# Patient Record
Sex: Female | Born: 1935 | Hispanic: Yes | Marital: Married | Smoking: Never smoker
Health system: Southern US, Community
[De-identification: ages and names within clinical notes are randomized; demographics above are authoritative.]

## PROBLEM LIST (undated history)

## (undated) DIAGNOSIS — R519 Headache, unspecified: Secondary | ICD-10-CM

## (undated) DIAGNOSIS — G2581 Restless legs syndrome: Secondary | ICD-10-CM

## (undated) DIAGNOSIS — I499 Cardiac arrhythmia, unspecified: Secondary | ICD-10-CM

## (undated) DIAGNOSIS — J449 Chronic obstructive pulmonary disease, unspecified: Secondary | ICD-10-CM

## (undated) DIAGNOSIS — K219 Gastro-esophageal reflux disease without esophagitis: Secondary | ICD-10-CM

## (undated) DIAGNOSIS — M1990 Unspecified osteoarthritis, unspecified site: Secondary | ICD-10-CM

## (undated) DIAGNOSIS — Z972 Presence of dental prosthetic device (complete) (partial): Secondary | ICD-10-CM

## (undated) DIAGNOSIS — I209 Angina pectoris, unspecified: Secondary | ICD-10-CM

## (undated) DIAGNOSIS — F419 Anxiety disorder, unspecified: Secondary | ICD-10-CM

## (undated) DIAGNOSIS — G4730 Sleep apnea, unspecified: Secondary | ICD-10-CM

## (undated) DIAGNOSIS — R51 Headache: Secondary | ICD-10-CM

## (undated) DIAGNOSIS — H9190 Unspecified hearing loss, unspecified ear: Secondary | ICD-10-CM

## (undated) DIAGNOSIS — R160 Hepatomegaly, not elsewhere classified: Secondary | ICD-10-CM

## (undated) DIAGNOSIS — R0601 Orthopnea: Secondary | ICD-10-CM

## (undated) DIAGNOSIS — Z9289 Personal history of other medical treatment: Secondary | ICD-10-CM

## (undated) DIAGNOSIS — H8109 Meniere's disease, unspecified ear: Secondary | ICD-10-CM

## (undated) DIAGNOSIS — R0600 Dyspnea, unspecified: Secondary | ICD-10-CM

## (undated) DIAGNOSIS — I10 Essential (primary) hypertension: Secondary | ICD-10-CM

## (undated) HISTORY — PX: BREAST BIOPSY: SHX20

## (undated) HISTORY — PX: CARDIAC CATHETERIZATION: SHX172

## (undated) HISTORY — PX: COLONOSCOPY: SHX174

---

## 2014-12-20 ENCOUNTER — Emergency Department
Admission: EM | Admit: 2014-12-20 | Discharge: 2014-12-20 | Disposition: A | Discharge status: Home / Self Care | Payer: Medicare (Managed Care) | Attending: Emergency Medicine | Admitting: Emergency Medicine

## 2014-12-20 ENCOUNTER — Emergency Department: Payer: Medicare (Managed Care)

## 2014-12-20 ENCOUNTER — Encounter: Payer: Self-pay | Admitting: Emergency Medicine

## 2014-12-20 DIAGNOSIS — I10 Essential (primary) hypertension: Secondary | ICD-10-CM | POA: Diagnosis not present

## 2014-12-20 DIAGNOSIS — R0602 Shortness of breath: Secondary | ICD-10-CM | POA: Diagnosis present

## 2014-12-20 DIAGNOSIS — R531 Weakness: Secondary | ICD-10-CM

## 2014-12-20 DIAGNOSIS — J209 Acute bronchitis, unspecified: Secondary | ICD-10-CM | POA: Diagnosis not present

## 2014-12-20 DIAGNOSIS — J40 Bronchitis, not specified as acute or chronic: Secondary | ICD-10-CM

## 2014-12-20 LAB — CBC
HCT: 37.4 % (ref 35.0–47.0)
Hemoglobin: 12.5 g/dL (ref 12.0–16.0)
MCH: 30.6 pg (ref 26.0–34.0)
MCHC: 33.4 g/dL (ref 32.0–36.0)
MCV: 91.8 fL (ref 80.0–100.0)
PLATELETS: 108 10*3/uL — AB (ref 150–440)
RBC: 4.07 MIL/uL (ref 3.80–5.20)
RDW: 13.9 % (ref 11.5–14.5)
WBC: 7.1 10*3/uL (ref 3.6–11.0)

## 2014-12-20 LAB — BASIC METABOLIC PANEL
ANION GAP: 3 — AB (ref 5–15)
BUN: 14 mg/dL (ref 6–20)
CALCIUM: 9.1 mg/dL (ref 8.9–10.3)
CO2: 30 mmol/L (ref 22–32)
CREATININE: 0.56 mg/dL (ref 0.44–1.00)
Chloride: 106 mmol/L (ref 101–111)
GFR calc Af Amer: >60 mL/min (ref >60)
GFR calc non Af Amer: >60 mL/min (ref >60)
GLUCOSE: 105 mg/dL — AB (ref 65–99)
Potassium: 3.5 mmol/L (ref 3.5–5.1)
Sodium: 139 mmol/L (ref 135–145)

## 2014-12-20 LAB — TROPONIN I: Troponin I: <0.03 ng/mL (ref <0.031)

## 2014-12-20 MED ORDER — SODIUM CHLORIDE 0.9 % IV BOLUS (SEPSIS)
500.0000 mL | Freq: Once | INTRAVENOUS | Status: AC
  Administered 2014-12-20: 500 mL via INTRAVENOUS

## 2014-12-20 MED ORDER — ALBUTEROL SULFATE (2.5 MG/3ML) 0.083% IN NEBU: 2.5000 mg | INHALATION_SOLUTION | RESPIRATORY_TRACT | Status: DC | PRN

## 2014-12-20 MED ORDER — LEVOFLOXACIN IN D5W 500 MG/100ML IV SOLN
500.0000 mg | Freq: Once | INTRAVENOUS | Status: AC
  Administered 2014-12-20: 500 mg via INTRAVENOUS
  Filled 2014-12-20: qty 100

## 2014-12-20 MED ORDER — LEVOFLOXACIN 500 MG PO TABS: 500.0000 mg | ORAL_TABLET | Freq: Every day | ORAL | Status: AC

## 2014-12-20 MED ORDER — HYDROCOD POLST-CPM POLST ER 10-8 MG/5ML PO SUER: 5.0000 mL | Freq: Two times a day (BID) | ORAL | Status: DC

## 2014-12-20 MED ORDER — HYDROCOD POLST-CPM POLST ER 10-8 MG/5ML PO SUER
5.0000 mL | Freq: Once | ORAL | Status: AC
  Administered 2014-12-20: 5 mL via ORAL
  Filled 2014-12-20: qty 5

## 2014-12-20 NOTE — Discharge Instructions (Signed)
1. Take antibiotic as prescribed (Levaquin 500 mg twice daily 5 days). 2. You may take cough medicine as needed (Tussionex). 3. You may use albuterol nebulizer every 4 hours as needed for breathing difficulty. 4. Return to the ER for worsening symptoms, persistent vomiting, difficulty breathing or other concerns.  Debilidad  (Weakness)  La debilidad es la falta de energa. Se puede sentir en todo el cuerpo (generalizada) o en una parte especfica del cuerpo (focalizada).Verner Chol. Algunas causas de la debilidad pueden ser graves. Es posible que necesite una evaluacin mdica ms profunda, especialmente si usted es anciano o tiene historia de inmunosupresin (como quimioterapia o VIH), enfermedad renal, enfermedad cardiaca o diabetes. CAUSAS  La debilidad puede deberse a muchas afecciones diferentes:   Infecciones.  Cansancio fsico extremo.  Hemorragia interna u otras prdidas de sangre que causa un dficit de glbulos rojos (anemia).  Deshidratacin. Esta causa es ms frecuente en personas mayores.  Efectos secundarios o anormalidades electrolticas por medicamentos, como analgsicos o sedantes.  Estrs emocional, ansiedad o depresin.  Problemas de circulacin, especialmente enfermedad arterial perifrica grave.  Enfermedades cardacas, como la fibrilacin auricular rpida, la bradicardia o la insuficiencia cardaca.  Los trastornos del [address] Streetsistema nervioso, como el sndrome de CarneyGuillain-Barr, esclerosis mltiple o ictus. DIAGNSTICO  Para hallar la causa de la debilidad, el mdico le har una historia clnica y un examen fsico. Si es necesario, le indicarn pruebas de laboratorio o Recruitment consultantradiografas.  TRATAMIENTO  El tratamiento de la debilidad depende de la causa de sus sntomas y puede Electrical engineervariar mucho.  INSTRUCCIONES PARA EL CUIDADO EN EL HOGAR   Descanse todo lo que sea necesario.  Consuma una dieta bien balanceada.  Trate de Materials engineerhacer ejercicio CarMaxtodos los das.  Tome slo medicamentos de venta  libre o recetados, segn las indicaciones del mdico. SOLICITE ATENCIN MDICA SI:   La debilidad parece empeorar o se extiende a otras partes del cuerpo.  Siente nuevos dolores o dolencias. SOLICITE ATENCIN MDICA DE INMEDIATO SI:   No puede realizar sus actividades normales diarias, como vestirse y alimentarse.  No puede subir y Architectural technologistbajar escaleras o se siente agotado cuando lo hace.  Tiene dificultad para respirar o le duele pecho.  Tiene dificultad para mover partes del cuerpo.  Siente debilidad en una sola zona del cuerpo o solo en un lado del cuerpo.  Tiene fiebre.  Tiene problemas para hablar o tragar.  No puede controlar la vejiga o los movimientos intestinales.  La materia fecal o el vmito son negros o Investment banker, operationaltienen sangre. ASEGRESE DE QUE:   Comprende estas instrucciones.  Controlar su enfermedad.  Solicitar ayuda de inmediato si no mejora o si empeora.   Esta informacin no tiene Theme park managercomo fin reemplazar el consejo del mdico. Asegrese de hacerle al mdico cualquier pregunta que tenga.   Document Released: 02/23/2006 Document Revised: 07/14/2011 Elsevier Interactive Patient Education [employer]2016 Elsevier Inc.

## 2014-12-20 NOTE — ED Provider Notes (Signed)
[facility] Emergency Department Provider Note  ____________________________________________  Time seen: Approximately 2:31 AM  I have reviewed the triage vital signs and the nursing notes.   HISTORY  Chief Complaint Shortness of Breath    HPI Ann Welch is a 81 y.o. female who presents to the ED from home with a chief complaint of nonproductive cough, chills, runny nose and shortness of breath. Onset of symptoms 3 days ago. Patient complains of subjective fever with chills, nonproductive cough associated with occasional shortness of breath. Also complains of runny nose and hoarse voice. Denies recent travel or trauma. Denies chest pain, abdominal pain, nausea, vomiting, diarrhea, dysuria. Complains of generalized weakness. Nothing makes her symptoms better or worse. States she has a nebulizer machine at home which she used several years ago when she had pneumonia, but she no longer has medicine for her nebulizer machine. Denies past medical history of lung issues.   Past medical history Hypertension Hyperlipidemia  There are no active problems to display for this patient.   No past surgical history on file.  No current outpatient prescriptions on file.  Allergies Review of patient's allergies indicates no known allergies.  No family history on file.  Social History Social History  Substance Use Topics  . Smoking status: Never Smoker   . Smokeless tobacco: Never Used  . Alcohol Use: No    Review of Systems Constitutional: Positive for subjective fever/chills. Positive for generalized weakness. Eyes: No visual changes. ENT: Positive for hoarse voice. No sore throat. Cardiovascular: Denies chest pain. Respiratory: Positive for nonproductive cough and occasional shortness of breath. Gastrointestinal: No abdominal pain.  No nausea, no vomiting.  No diarrhea.  No constipation. Genitourinary: Negative for dysuria. Musculoskeletal: Negative for  back pain. Skin: Negative for rash. Neurological: Negative for headaches, focal weakness or numbness.  10-point ROS otherwise negative.  ____________________________________________   PHYSICAL EXAM:  VITAL SIGNS: ED Triage Vitals  Enc Vitals Group     BP 12/20/14 0135 148/73 mmHg     Pulse Rate 12/20/14 0135 91     Resp 12/20/14 0135 19     Temp 12/20/14 0135 98 F (36.7 C)     Temp Source 12/20/14 0135 Oral     SpO2 12/20/14 0135 98 %     Weight --      Height --      Head Cir --      Peak Flow --      Pain Score 12/20/14 0128 9     Pain Loc --      Pain Edu? --      Excl. in GC? --     Constitutional: Alert and oriented. Well appearing and in no acute distress. Eyes: Conjunctivae are normal. PERRL. EOMI. Head: Atraumatic. Nose: Congestion/rhinnorhea. Mouth/Throat: Mucous membranes are moist.  Oropharynx non-erythematous. Mildly hoarse voice. There is no tonsillar exudate, swelling or peritonsillar abscess. There is no muffled voice or drooling. Neck: No stridor.  No carotid bruits. Hematological/Lymphatic/Immunilogical: No cervical lymphadenopathy. Cardiovascular: Normal rate, regular rhythm. Grossly normal heart sounds.  Good peripheral circulation. Respiratory: Normal respiratory effort.  No retractions. Lungs CTAB. Specifically, there is no wheezing or rales. Gastrointestinal: Soft and nontender. No distention. No abdominal bruits. No CVA tenderness. Musculoskeletal: No lower extremity tenderness nor edema.  No joint effusions. Neurologic:  Normal speech and language. No gross focal neurologic deficits are appreciated. No gait instability. Skin:  Skin is warm, dry and intact. No rash noted. Psychiatric: Mood and affect are normal.  Speech and behavior are normal.  ____________________________________________   LABS (all labs ordered are listed, but only abnormal results are displayed)  Labs Reviewed  BASIC METABOLIC PANEL - Abnormal; Notable for the following:     Glucose, Bld 105 (*)    Anion gap 3 (*)    All other components within normal limits  CBC - Abnormal; Notable for the following:    Platelets 108 (*)    All other components within normal limits  TROPONIN I   ____________________________________________  EKG  ED ECG REPORT I, SUNG,JADE J, the attending physician, personally viewed and interpreted this ECG.   Date: 12/20/2014  EKG Time: 0127  Rate: 91  Rhythm: normal EKG, normal sinus rhythm  Axis: LAD  Intervals:none  ST&T Change: Nonspecific  ____________________________________________  RADIOLOGY  Chest 2 view (viewed by me, interpreted per Dr. Andria MeuseStevens): No active cardiopulmonary disease.  ____________________________________________   PROCEDURES  Procedure(s) performed: None  Critical Care performed: No  ____________________________________________   INITIAL IMPRESSION / ASSESSMENT AND PLAN / ED COURSE  Pertinent labs & imaging results that were available during my care of the patient were reviewed by me and considered in my medical decision making (see chart for details).  81 year old female who presents with nonproductive cough and subjective fever 2 days. Laboratory and imaging results unremarkable for pneumonia. However, given patient's elderly age she is at high risk for pneumonia. Will infuse IV fluids for her complaint of generalized weakness and start a course of antibiotics.  ----------------------------------------- 3:58 AM on 12/20/2014 -----------------------------------------  Patient improved. Inpatient for discharge. IV antibiotics almost finished infusing. Strict return precautions given. Patient and family verbalize understanding and agree with plan of care. ____________________________________________   FINAL CLINICAL IMPRESSION(S) / ED DIAGNOSES  Final diagnoses:  Bronchitis  Weakness      Irean HongJade J Sung, MD 12/20/14 [phone]

## 2014-12-20 NOTE — ED Notes (Signed)
  Pt presents to ED with c/on SOB, dry cough and runny nose x3 days. Pt alerts and oriented x4 at this time.

## 2015-02-19 ENCOUNTER — Other Ambulatory Visit: Payer: Self-pay | Admitting: Family Medicine

## 2015-02-19 DIAGNOSIS — Z1231 Encounter for screening mammogram for malignant neoplasm of breast: Secondary | ICD-10-CM

## 2015-02-26 ENCOUNTER — Ambulatory Visit: Payer: Medicare (Managed Care) | Attending: Family Medicine

## 2016-06-25 ENCOUNTER — Emergency Department
Admission: EM | Admit: 2016-06-25 | Discharge: 2016-06-25 | Disposition: A | Discharge status: Home / Self Care | Payer: Medicare Other | Attending: Emergency Medicine | Admitting: Emergency Medicine

## 2016-06-25 ENCOUNTER — Encounter: Payer: Self-pay | Admitting: Emergency Medicine

## 2016-06-25 DIAGNOSIS — I10 Essential (primary) hypertension: Secondary | ICD-10-CM | POA: Insufficient documentation

## 2016-06-25 DIAGNOSIS — Z79899 Other long term (current) drug therapy: Secondary | ICD-10-CM | POA: Diagnosis not present

## 2016-06-25 DIAGNOSIS — L509 Urticaria, unspecified: Secondary | ICD-10-CM

## 2016-06-25 HISTORY — DX: Essential (primary) hypertension: I10

## 2016-06-25 MED ORDER — METHYLPREDNISOLONE SODIUM SUCC 125 MG IJ SOLR
70.0000 mg | Freq: Once | INTRAMUSCULAR | Status: AC
  Administered 2016-06-25: 70 mg via INTRAMUSCULAR
  Filled 2016-06-25: qty 2

## 2016-06-25 MED ORDER — PREDNISONE 10 MG (21) PO TBPK: ORAL_TABLET | Freq: Every day | ORAL | 0 refills | Status: AC

## 2016-06-25 NOTE — ED Provider Notes (Signed)
[facility] Emergency Department Provider Note  ____________________________________________  Time seen: Approximately 8:07 PM  I have reviewed the triage vital signs and the nursing notes.   HISTORY  Chief Complaint Rash    HPI Ann Welch is a 83 y.o. female presenting to the emergency department with diffuse hives of the left upper extremity after receiving Zostavax yesterday. Patient states that she has had myalgias throughout the day. She denies facial edema, shortness of breath, chest pain, chest tightness, nausea, vomiting or abdominal pain. No alleviating measures have been attempted. Patient has been afebrile. 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   Past Medical History:  Diagnosis Date  . Hypertension     There are no active problems to display for this patient.   History reviewed. No pertinent surgical history.  Prior to Admission medications   Medication Sig Start Date End Date Taking? Authorizing Provider  albuterol (PROVENTIL) (2.5 MG/3ML) 0.083% nebulizer solution Take 3 mLs (2.5 mg total) by nebulization every 4 (four) hours as needed for wheezing or shortness of breath. 12/20/14   Irean Hong, MD  chlorpheniramine-HYDROcodone (TUSSIONEX PENNKINETIC ER) 10-8 MG/5ML SUER Take 5 mLs by mouth 2 (two) times daily. 12/20/14   Irean Hong, MD  predniSONE (STERAPRED UNI-PAK 21 TAB) 10 MG (21) TBPK tablet Take by mouth daily. Take 6 tablets the first day, take 5 tablets the second day, take 4 tablets the third day, take 3 tablets the fourth day, take 2 tablets the fifth day, take 1 tablet the sixth day. 06/25/16 07/01/16  Orvil Feil, PA-C    Allergies Patient has no known allergies.  No family history on file.  Social History Social History  Substance Use Topics  . Smoking status: Never Smoker  . Smokeless tobacco: Never Used  . Alcohol use No     Review of Systems  Constitutional: No fever/chills Eyes: No visual  changes. No discharge ENT: No upper respiratory complaints. Cardiovascular: no chest pain. Respiratory: no cough. No SOB. Gastrointestinal: No abdominal pain.  No nausea, no vomiting.  No diarrhea.  No constipation. Musculoskeletal: Negative for musculoskeletal pain. Skin: Patient has hives of the left upper extremity Neurological: Negative for headaches, focal weakness or numbness.   ____________________________________________   PHYSICAL EXAM:  VITAL SIGNS: ED Triage Vitals [06/25/16 1907]  Enc Vitals Group     BP 138/73     Pulse Rate 92     Resp 18     Temp 99.1 F (37.3 C)     Temp Source Oral     SpO2 98 %     Weight 129 lb (58.5 kg)     Height 5\' 1"  (1.549 m)     Head Circumference      Peak Flow      Pain Score 9     Pain Loc      Pain Edu?      Excl. in GC?      Constitutional: Alert and oriented. Well appearing and in no acute distress. Eyes: Conjunctivae are normal. PERRL. EOMI. Head: Atraumatic. Cardiovascular: Normal rate, regular rhythm. Normal S1 and S2.  Good peripheral circulation. Respiratory: Normal respiratory effort without tachypnea or retractions. Lungs CTAB. Good air entry to the bases with no decreased or absent breath sounds. Musculoskeletal: Full range of motion to all extremities. No gross deformities appreciated. Neurologic:  Normal speech and language. No gross focal neurologic deficits are appreciated.  Skin: Patient has diffuse hives of the left upper  extremity. Psychiatric: Mood and affect are normal. Speech and behavior are normal. Patient exhibits appropriate insight and judgement.   ____________________________________________   LABS (all labs ordered are listed, but only abnormal results are displayed)  Labs Reviewed - No data to display ____________________________________________  EKG   ____________________________________________  RADIOLOGY   No results  found.  ____________________________________________    PROCEDURES  Procedure(s) performed:    Procedures    Medications  methylPREDNISolone sodium succinate (SOLU-MEDROL) 125 mg/2 mL injection 70 mg (70 mg Intramuscular Given 06/25/16 2009)     ____________________________________________   INITIAL IMPRESSION / ASSESSMENT AND PLAN / ED COURSE  Pertinent labs & imaging results that were available during my care of the patient were reviewed by me and considered in my medical decision making (see chart for details).  Review of the [place] CSRS was performed in accordance of the [facility] prior to dispensing any controlled drugs.    Assessment and plan: Hives: Patient presents to the emergency department with diffuse hives of the left upper extremity after receiving Zostavax. Overall physical exam was reassuring. Patient was given an injection of Solu-Medrol in the emergency department. She was discharged with tapered prednisone. Vital signs were reassuring prior to discharge. All patient questions were answered.     ____________________________________________  FINAL CLINICAL IMPRESSION(S) / ED DIAGNOSES  Final diagnoses:  Hives      NEW MEDICATIONS STARTED DURING THIS VISIT:  New Prescriptions   PREDNISONE (STERAPRED UNI-PAK 21 TAB) 10 MG (21) TBPK TABLET    Take by mouth daily. Take 6 tablets the first day, take 5 tablets the second day, take 4 tablets the third day, take 3 tablets the fourth day, take 2 tablets the fifth day, take 1 tablet the sixth day.        This chart was dictated using voice recognition software/Dragon. Despite best efforts to proofread, errors can occur which can change the meaning. Any change was purely unintentional.    Gasper LloydWoods, Jaclyn M, PA-C 06/25/16 2013    Loleta RoseForbach, Cory, MD 06/25/16 2135

## 2016-06-25 NOTE — ED Triage Notes (Signed)
Pt ambulatory to triage with steady gait. Pt reports had shingles vaccine yesterday in LEFT arm and developed swelling and pain to LEFT upper arm last night. Hot to touch. Redness and swelling noted. Pt denies shortness of breath. Pt speaking in complete sentences, respirations even and unlabored.

## 2016-07-09 ENCOUNTER — Emergency Department: Payer: Medicare Other

## 2016-07-09 ENCOUNTER — Emergency Department
Admission: EM | Admit: 2016-07-09 | Discharge: 2016-07-09 | Disposition: A | Discharge status: Home / Self Care | Payer: Medicare Other | Attending: Emergency Medicine | Admitting: Emergency Medicine

## 2016-07-09 DIAGNOSIS — R0789 Other chest pain: Secondary | ICD-10-CM | POA: Diagnosis not present

## 2016-07-09 DIAGNOSIS — I10 Essential (primary) hypertension: Secondary | ICD-10-CM | POA: Diagnosis not present

## 2016-07-09 DIAGNOSIS — R079 Chest pain, unspecified: Secondary | ICD-10-CM

## 2016-07-09 DIAGNOSIS — Z79899 Other long term (current) drug therapy: Secondary | ICD-10-CM | POA: Diagnosis not present

## 2016-07-09 DIAGNOSIS — R000 Tachycardia, unspecified: Secondary | ICD-10-CM | POA: Insufficient documentation

## 2016-07-09 DIAGNOSIS — Z7982 Long term (current) use of aspirin: Secondary | ICD-10-CM | POA: Diagnosis not present

## 2016-07-09 LAB — BASIC METABOLIC PANEL
Anion gap: 7 (ref 5–15)
BUN: 10 mg/dL (ref 6–20)
CALCIUM: 9.3 mg/dL (ref 8.9–10.3)
CO2: 26 mmol/L (ref 22–32)
CREATININE: 0.45 mg/dL (ref 0.44–1.00)
Chloride: 104 mmol/L (ref 101–111)
Glucose, Bld: 111 mg/dL — ABNORMAL HIGH (ref 65–99)
Potassium: 3.6 mmol/L (ref 3.5–5.1)
Sodium: 137 mmol/L (ref 135–145)

## 2016-07-09 LAB — CBC
HCT: 39.1 % (ref 35.0–47.0)
Hemoglobin: 13.3 g/dL (ref 12.0–16.0)
MCH: 30.3 pg (ref 26.0–34.0)
MCHC: 34 g/dL (ref 32.0–36.0)
MCV: 89.1 fL (ref 80.0–100.0)
PLATELETS: 162 10*3/uL (ref 150–440)
RBC: 4.39 MIL/uL (ref 3.80–5.20)
RDW: 13.8 % (ref 11.5–14.5)
WBC: 6.3 10*3/uL (ref 3.6–11.0)

## 2016-07-09 LAB — TROPONIN I: Troponin I: <0.03 ng/mL (ref <0.03)

## 2016-07-09 MED ORDER — DILTIAZEM HCL 60 MG PO TABS
60.0000 mg | ORAL_TABLET | Freq: Two times a day (BID) | ORAL | 11 refills | Status: DC
Start: 2016-07-09 — End: 2017-12-29

## 2016-07-09 NOTE — ED Triage Notes (Signed)
Pt reports to ED w/ c/o chest tightness that started today at home.  Per EMS, pt checked HR w/ home pulse oximeter and HR 170's. Pt A/OX4, resp even and unlabored. HR 76 in triage.  EMS sts that HR 93 on scene.  Pt sts that she takes cardizem.  EMS gave pt 324 ASA.  Pt reports no chest tightness at this time, no dizziness or nausea.

## 2016-07-09 NOTE — ED Provider Notes (Signed)
[facility] Emergency Department Provider Note       Time seen: ----------------------------------------- 12:02 PM on 07/09/2016 -----------------------------------------     I have reviewed the triage vital signs and the nursing notes.   HISTORY   Chief Complaint Chest Pain    HPI Ann Welch is a 83 y.o. female who presents to the ED for chest tightness that started today at home. According to EMS she had checked her heart rate with a home pulse oximeter and a heart rate was in the 170s. Lasted for around 15 minutes and then subsequently resolved. Patient states she takes Cardizem, EMS gave aspirin prior to arrival. She reports no chest tightness at this time, no dizziness or nausea. She denies any recent changes in her medicines.   Past Medical History:  Diagnosis Date  . Hypertension     There are no active problems to display for this patient.   History reviewed. No pertinent surgical history.  Allergies Patient has no known allergies.  Social History Social History  Substance Use Topics  . Smoking status: Never Smoker  . Smokeless tobacco: Never Used  . Alcohol use No    Review of Systems Constitutional: Negative for fever. Cardiovascular: Positive for chest tightness, palpitations Respiratory: Negative for shortness of breath. Gastrointestinal: Negative for abdominal pain, vomiting and diarrhea. Genitourinary: Negative for dysuria. Musculoskeletal: Negative for back pain. Skin: Negative for rash. Neurological: Negative for headaches, focal weakness or numbness.  All systems negative/normal/unremarkable except as stated in the HPI  ____________________________________________   PHYSICAL EXAM:  VITAL SIGNS: ED Triage Vitals  Enc Vitals Group     BP 07/09/16 1149 (!) 138/97     Pulse Rate 07/09/16 1149 80     Resp 07/09/16 1149 17     Temp 07/09/16 1149 97.7 F (36.5 C)     Temp Source 07/09/16 1149 Oral     SpO2  07/09/16 1149 93 %     Weight 07/09/16 1141 130 lb (59 kg)     Height 07/09/16 1141 5\' 1"  (1.549 m)     Head Circumference --      Peak Flow --      Pain Score 07/09/16 1141 5     Pain Loc --      Pain Edu? --      Excl. in GC? --     Constitutional: Alert and oriented. Well appearing and in no distress. Eyes: Conjunctivae are normal. Normal extraocular movements. ENT   Head: Normocephalic and atraumatic.   Nose: No congestion/rhinnorhea.   Mouth/Throat: Mucous membranes are moist.   Neck: No stridor. Cardiovascular: Normal rate, regular rhythm. No murmurs, rubs, or gallops. Respiratory: Normal respiratory effort without tachypnea nor retractions. Breath sounds are clear and equal bilaterally. No wheezes/rales/rhonchi. Gastrointestinal: Soft and nontender. Normal bowel sounds Musculoskeletal: Nontender with normal range of motion in extremities. No lower extremity tenderness nor edema. Neurologic:  Normal speech and language. No gross focal neurologic deficits are appreciated.  Skin:  Skin is warm, dry and intact. No rash noted. Psychiatric: Mood and affect are normal. Speech and behavior are normal.  ____________________________________________  EKG: Interpreted by me. Sinus rhythm. 81 bpm, normal PR interval, normal QRS, normal QT, left axis deviation  ____________________________________________  ED COURSE:  Pertinent labs & imaging results that were available during my care of the patient were reviewed by me and considered in my medical decision making (see chart for details). Patient presents for chest tightness and tachycardia, we will assess with  labs and imaging as indicated.   Procedures ____________________________________________   LABS (pertinent positives/negatives)  Labs Reviewed  BASIC METABOLIC PANEL - Abnormal; Notable for the following:       Result Value   Glucose, Bld 111 (*)    All other components within normal limits  CBC  TROPONIN I   TROPONIN I    RADIOLOGY  Chest x-ray IMPRESSION: No active cardiopulmonary disease. ____________________________________________  FINAL ASSESSMENT AND PLAN  Chest pain, palpitations  Plan: Patient's labs and imaging were dictated above. Patient had presented for tachycardia and chest tightness. No further tachydysrhythmia, initial troponin is negative.   Emily FilbertWilliams, Tesia Lybrand E, MD   Note: This note was generated in part or whole with voice recognition software. Voice recognition is usually quite accurate but there are transcription errors that can and very often do occur. I apologize for any typographical errors that were not detected and corrected.     Emily FilbertWilliams, Lakshya Mcgillicuddy E, MD 07/09/16 [phone]

## 2016-07-09 NOTE — ED Notes (Signed)
Patient transported to X-ray 

## 2016-07-09 NOTE — ED Notes (Signed)
Light green top sent to lab 

## 2016-10-22 ENCOUNTER — Emergency Department: Payer: Medicare Other

## 2016-10-22 ENCOUNTER — Emergency Department
Admission: EM | Admit: 2016-10-22 | Discharge: 2016-10-22 | Disposition: A | Discharge status: Home / Self Care | Payer: Medicare Other | Attending: Emergency Medicine | Admitting: Emergency Medicine

## 2016-10-22 DIAGNOSIS — R42 Dizziness and giddiness: Secondary | ICD-10-CM | POA: Insufficient documentation

## 2016-10-22 DIAGNOSIS — Z79899 Other long term (current) drug therapy: Secondary | ICD-10-CM | POA: Diagnosis not present

## 2016-10-22 DIAGNOSIS — R110 Nausea: Secondary | ICD-10-CM | POA: Diagnosis not present

## 2016-10-22 DIAGNOSIS — Z7982 Long term (current) use of aspirin: Secondary | ICD-10-CM | POA: Diagnosis not present

## 2016-10-22 DIAGNOSIS — I10 Essential (primary) hypertension: Secondary | ICD-10-CM | POA: Diagnosis not present

## 2016-10-22 LAB — CBC WITH DIFFERENTIAL/PLATELET
BASOS ABS: 0 10*3/uL (ref 0–0.1)
Basophils Relative: 0 %
Eosinophils Absolute: 0.2 10*3/uL (ref 0–0.7)
Eosinophils Relative: 4 %
HEMATOCRIT: 38.4 % (ref 35.0–47.0)
Hemoglobin: 13 g/dL (ref 12.0–16.0)
LYMPHS ABS: 1 10*3/uL (ref 1.0–3.6)
LYMPHS PCT: 17 %
MCH: 30.9 pg (ref 26.0–34.0)
MCHC: 34 g/dL (ref 32.0–36.0)
MCV: 90.9 fL (ref 80.0–100.0)
MONO ABS: 0.4 10*3/uL (ref 0.2–0.9)
Monocytes Relative: 6 %
NEUTROS ABS: 4.4 10*3/uL (ref 1.4–6.5)
Neutrophils Relative %: 73 %
Platelets: 149 10*3/uL — ABNORMAL LOW (ref 150–440)
RBC: 4.22 MIL/uL (ref 3.80–5.20)
RDW: 13.9 % (ref 11.5–14.5)
WBC: 6 10*3/uL (ref 3.6–11.0)

## 2016-10-22 LAB — HEPATIC FUNCTION PANEL
ALT: 21 U/L (ref 14–54)
AST: 22 U/L (ref 15–41)
Albumin: 3.9 g/dL (ref 3.5–5.0)
Alkaline Phosphatase: 63 U/L (ref 38–126)
TOTAL PROTEIN: 6.6 g/dL (ref 6.5–8.1)
Total Bilirubin: 0.3 mg/dL (ref 0.3–1.2)

## 2016-10-22 LAB — BASIC METABOLIC PANEL
ANION GAP: 8 (ref 5–15)
BUN: 15 mg/dL (ref 6–20)
CALCIUM: 9.1 mg/dL (ref 8.9–10.3)
CO2: 26 mmol/L (ref 22–32)
Chloride: 102 mmol/L (ref 101–111)
Creatinine, Ser: 0.62 mg/dL (ref 0.44–1.00)
GLUCOSE: 139 mg/dL — AB (ref 65–99)
POTASSIUM: 3.9 mmol/L (ref 3.5–5.1)
SODIUM: 136 mmol/L (ref 135–145)

## 2016-10-22 LAB — URINALYSIS, COMPLETE (UACMP) WITH MICROSCOPIC
BACTERIA UA: NONE SEEN
Bilirubin Urine: NEGATIVE
Glucose, UA: NEGATIVE mg/dL
Hgb urine dipstick: NEGATIVE
KETONES UR: NEGATIVE mg/dL
Nitrite: NEGATIVE
PROTEIN: NEGATIVE mg/dL
Specific Gravity, Urine: 1.005 (ref 1.005–1.030)
pH: 7 (ref 5.0–8.0)

## 2016-10-22 LAB — TROPONIN I

## 2016-10-22 MED ORDER — SODIUM CHLORIDE 0.9 % IV BOLUS (SEPSIS)
1000.0000 mL | Freq: Once | INTRAVENOUS | Status: AC
  Administered 2016-10-22: 1000 mL via INTRAVENOUS

## 2016-10-22 NOTE — Discharge Instructions (Signed)
Fortunately today your blood work and your MRI were reassuring. Please make an appointment to follow-up with your primary care physician this coming Monday for reevaluation and return to the emergency department sooner for any concerns whatsoever.  It was a pleasure to take care of you today, and thank you for coming to our emergency department.  If you have any questions or concerns before leaving please ask the nurse to grab me and I'm more than happy to go through your aftercare instructions again.  If you were prescribed any opioid pain medication today such as Norco, Vicodin, Percocet, morphine, hydrocodone, or oxycodone please make sure you do not drive when you are taking this medication as it can alter your ability to drive safely.  If you have any concerns once you are home that you are not improving or are in fact getting worse before you can make it to your follow-up appointment, please do not hesitate to call 911 and come back for further evaluation.  Merrily Brittle, MD  Results for orders placed or performed during the hospital encounter of 10/22/16  Basic metabolic panel  Result Value Ref Range   Sodium 136 135 - 145 mmol/L   Potassium 3.9 3.5 - 5.1 mmol/L   Chloride 102 101 - 111 mmol/L   CO2 26 22 - 32 mmol/L   Glucose, Bld 139 (H) 65 - 99 mg/dL   BUN 15 6 - 20 mg/dL   Creatinine, Ser 1.61 0.44 - 1.00 mg/dL   Calcium 9.1 8.9 - 09.6 mg/dL   GFR calc non Af Amer >60 >60 mL/min   GFR calc Af Amer >60 >60 mL/min   Anion gap 8 5 - 15  Hepatic function panel  Result Value Ref Range   Total Protein 6.6 6.5 - 8.1 g/dL   Albumin 3.9 3.5 - 5.0 g/dL   AST 22 15 - 41 U/L   ALT 21 14 - 54 U/L   Alkaline Phosphatase 63 38 - 126 U/L   Total Bilirubin 0.3 0.3 - 1.2 mg/dL   Bilirubin, Direct <0.4 (L) 0.1 - 0.5 mg/dL   Indirect Bilirubin NOT CALCULATED 0.3 - 0.9 mg/dL  Troponin I  Result Value Ref Range   Troponin I <0.03 <0.03 ng/mL  CBC with Differential  Result Value Ref Range     WBC 6.0 3.6 - 11.0 K/uL   RBC 4.22 3.80 - 5.20 MIL/uL   Hemoglobin 13.0 12.0 - 16.0 g/dL   HCT 54.0 98.1 - 19.1 %   MCV 90.9 80.0 - 100.0 fL   MCH 30.9 26.0 - 34.0 pg   MCHC 34.0 32.0 - 36.0 g/dL   RDW 47.8 29.5 - 62.1 %   Platelets 149 (L) 150 - 440 K/uL   Neutrophils Relative % 73 %   Neutro Abs 4.4 1.4 - 6.5 K/uL   Lymphocytes Relative 17 %   Lymphs Abs 1.0 1.0 - 3.6 K/uL   Monocytes Relative 6 %   Monocytes Absolute 0.4 0.2 - 0.9 K/uL   Eosinophils Relative 4 %   Eosinophils Absolute 0.2 0 - 0.7 K/uL   Basophils Relative 0 %   Basophils Absolute 0.0 0 - 0.1 K/uL  Urinalysis, Complete w Microscopic  Result Value Ref Range   Color, Urine STRAW (A) YELLOW   APPearance CLEAR (A) CLEAR   Specific Gravity, Urine 1.005 1.005 - 1.030   pH 7.0 5.0 - 8.0   Glucose, UA NEGATIVE NEGATIVE mg/dL   Hgb urine dipstick NEGATIVE NEGATIVE  Bilirubin Urine NEGATIVE NEGATIVE   Ketones, ur NEGATIVE NEGATIVE mg/dL   Protein, ur NEGATIVE NEGATIVE mg/dL   Nitrite NEGATIVE NEGATIVE   Leukocytes, UA TRACE (A) NEGATIVE   RBC / HPF 0-5 0 - 5 RBC/hpf   WBC, UA 0-5 0 - 5 WBC/hpf   Bacteria, UA NONE SEEN NONE SEEN   Squamous Epithelial / LPF 0-5 (A) NONE SEEN   Ct Head Wo Contrast  Result Date: 10/22/2016 CLINICAL DATA:  Nausea and dizziness today. EXAM: CT HEAD WITHOUT CONTRAST TECHNIQUE: Contiguous axial images were obtained from the base of the skull through the vertex without intravenous contrast. COMPARISON:  None. FINDINGS: Brain: No evidence of acute infarction, hemorrhage, hydrocephalus, extra-axial collection or mass lesion/mass effect. Vascular: No hyperdense vessel or unexpected calcification. Skull: Normal. Negative for fracture or focal lesion. Sinuses/Orbits: No acute finding. Other: None. IMPRESSION: No acute intracranial findings. Electronically Signed   By: Elberta Fortis M.D.   On: 10/22/2016 18:43   Mr Brain Wo Contrast (neuro Protocol)  Result Date: 10/22/2016 CLINICAL DATA:  83  y/o  F; central persistent vertigo. EXAM: MRI HEAD WITHOUT CONTRAST TECHNIQUE: Multiplanar, multiecho pulse sequences of the brain and surrounding structures were obtained without intravenous contrast. COMPARISON:  10/22/2016 CT of the head. FINDINGS: Brain: No acute infarction, hemorrhage, hydrocephalus, extra-axial collection or mass lesion. Few nonspecific foci of T2 FLAIR hyperintense signal abnormality in subcortical and periventricular white matter are compatible with mild chronic microvascular ischemic changes for age. Mild brain parenchymal volume loss. Vascular: Normal flow voids. Skull and upper cervical spine: Normal marrow signal. Sinuses/Orbits: Negative. Other: None. IMPRESSION: 1. No acute intracranial abnormality identified. 2. Mild chronic microvascular ischemic changes and mild parenchymal volume loss of the brain. Electronically Signed   By: Mitzi Hansen M.D.   On: 10/22/2016 22:42   Dg Chest Port 1 View  Result Date: 10/22/2016 CLINICAL DATA:  Acute, weakness and cough. EXAM: PORTABLE CHEST 1 VIEW COMPARISON:  07/09/2016 chest radiograph FINDINGS: The cardiomediastinal silhouette is unremarkable. There is no evidence of focal airspace disease, pulmonary edema, suspicious pulmonary nodule/mass, pleural effusion, or pneumothorax. No acute bony abnormalities are identified. IMPRESSION: No active disease. Electronically Signed   By: Harmon Pier M.D.   On: 10/22/2016 18:15

## 2016-10-22 NOTE — ED Provider Notes (Signed)
[facility] Emergency Department Provider Note  ____________________________________________   First MD Initiated Contact with Patient 10/22/16 1759     (approximate)  I have reviewed the triage vital signs and the nursing notes.   HISTORY  Chief Complaint Dizziness and Nausea   HPI Ann Welch is a 83 y.o. female who comes to the emergency department via EMS with nausea vomiting and lightheadedness. She said she was lying on the couch this afternoon when she stood up to walk towards the kitchen when she felt like she couldn't walk a straight line. She began veering off to the right nearly fell down. She found it difficult to stand up and every time she does stand up she feels lightheaded and off balance. One week ago her husband died and she has been having a difficult time dealing with the grief. She recently began taking alprazolam. She has no headache. She has no numbness or weakness. Her symptoms do persist even when she is at rest.   Past Medical History:  Diagnosis Date  . Hypertension     There are no active problems to display for this patient.   History reviewed. No pertinent surgical history.  Prior to Admission medications   Medication Sig Start Date End Date Taking? Authorizing Provider  aspirin EC 81 MG tablet Take 81 mg by mouth daily.   Yes [provider]  cholecalciferol (VITAMIN D) 1000 units tablet Take 1,000 Units by mouth daily.   Yes [provider]  diltiazem (CARDIZEM) 60 MG tablet Take 1 tablet (60 mg total) by mouth 2 (two) times daily. Patient taking differently: Take 30 mg by mouth daily.  07/09/16 07/09/17 Yes Emily Filbert, MD  Melatonin 5 MG TABS Take 1 tablet by mouth at bedtime.   Yes [provider]  Multiple Vitamin (MULTIVITAMIN) tablet Take 1 tablet by mouth daily.   Yes [provider]  vitamin E 400 UNIT capsule Take 400 Units by mouth daily.   Yes [provider]  albuterol (PROVENTIL) (2.5 MG/3ML) 0.083% nebulizer solution Take 3 mLs (2.5 mg total) by nebulization every 4 (four) hours as needed for wheezing or shortness of breath. 12/20/14   Irean Hong, MD  butalbital-acetaminophen-caffeine (FIORICET, [facility]) [phone] MG tablet  06/18/16   [provider]  chlorpheniramine-HYDROcodone Stevphen Meuse PENNKINETIC ER) 10-8 MG/5ML SUER Take 5 mLs by mouth 2 (two) times daily. Patient not taking: Reported on 10/22/2016 12/20/14   Irean Hong, MD    Allergies Patient has no known allergies.  History reviewed. No pertinent family history.  Social History Social History  Substance Use Topics  . Smoking status: Never Smoker  . Smokeless tobacco: Never Used  . Alcohol use No    Review of Systems Constitutional: No fever/chills Eyes: No visual changes. ENT: No sore throat. Cardiovascular: Denies chest pain. Respiratory: Denies shortness of breath. Gastrointestinal: No abdominal pain.  positive for nausea, no vomiting.  No diarrhea.  No constipation. Genitourinary: Negative for dysuria. Musculoskeletal: Negative for back pain. Skin: Negative for rash. Neurological: Negative for headaches, positive for ataxia   ____________________________________________   PHYSICAL EXAM:  VITAL SIGNS: ED Triage Vitals  Enc Vitals Group     BP      Pulse      Resp      Temp      Temp src      SpO2      Weight      Height  Head Circumference      Peak Flow      Pain Score      Pain Loc      Pain Edu?      Excl. in GC?     Constitutional: alert and oriented 4 appears somewhat uncomfortable closing her eyes frequently Eyes: PERRL EOMI.  no nystagmus appreciated Head: Atraumatic. Nose: No congestion/rhinnorhea. Mouth/Throat: No trismus Neck: No stridor.   Cardiovascular: Normal rate, regular rhythm. Grossly normal heart sounds.  Good peripheral circulation. Respiratory: Normal respiratory effort.  No retractions. Lungs  CTAB and moving good air Gastrointestinal: soft nontender Musculoskeletal: No lower extremity edema   Neurologic:  normal speech and language Cranial nerves II through XII intact No pronator drift Normal finger-nose-finger She does have truncal ataxia Skin:  Skin is warm, dry and intact. No rash noted. Psychiatric: Mood and affect are normal. Speech and behavior are normal.    ____________________________________________   DIFFERENTIAL includes but not limited to  dehydration, therapeutic misadventure of benzodiazepine, central vertigo, peripheral vertigo, anxiety reaction ____________________________________________   LABS (all labs ordered are listed, but only abnormal results are displayed)  Labs Reviewed  BASIC METABOLIC PANEL - Abnormal; Notable for the following:       Result Value   Glucose, Bld 139 (*)    All other components within normal limits  HEPATIC FUNCTION PANEL - Abnormal; Notable for the following:    Bilirubin, Direct <0.1 (*)    All other components within normal limits  CBC WITH DIFFERENTIAL/PLATELET - Abnormal; Notable for the following:    Platelets 149 (*)    All other components within normal limits  URINALYSIS, COMPLETE (UACMP) WITH MICROSCOPIC - Abnormal; Notable for the following:    Color, Urine STRAW (*)    APPearance CLEAR (*)    Leukocytes, UA TRACE (*)    Squamous Epithelial / LPF 0-5 (*)    All other components within normal limits  TROPONIN I    blood work reviewed and interpreted by me  with no acute disease noted __________________________________________  EKG  ED ECG REPORT I, Merrily Brittle, the attending physician, personally viewed and interpreted this ECG.  Date: 10/22/2016 EKG Time:  Rate: 72 Rhythm: normal sinus rhythm QRS Axis: normal Intervals: normal ST/T Wave abnormalities: normal Narrative Interpretation: no evidence of acute ischemia ________________________________________  RADIOLOGY chest x-ray reviewed  by me no acute disease Head CT reviewed by me as no acute disease MRI reviewed by me shows no acute disease ____________________________________________   PROCEDURES  Procedure(s) performed: no  Procedures  Critical Care performed: no  Observation: no ____________________________________________   INITIAL IMPRESSION / ASSESSMENT AND PLAN / ED COURSE  Pertinent labs & imaging results that were available during my care of the patient were reviewed by me and considered in my medical decision making (see chart for details).  The patient arrives with truncal ataxia and appears nauseated and quite uncomfortable. Her symptoms do persist even at rest. Head CT is unremarkable however given her persistent symptoms and concern that she could have a central process. MRI is pending.     The patient feels markedly improved after fluids and observation. Fortunately her MRI is negative for acute central process. This is likely a grief reaction and dehydration and possibly a therapeutic misadventure with alprazolam. The patient is discharged home with her son in improved condition.  She verbalizes understanding and agreement with the plan. ____________________________________________   FINAL CLINICAL IMPRESSION(S) / ED DIAGNOSES  Final diagnoses:  Dizziness  Lightheaded  NEW MEDICATIONS STARTED DURING THIS VISIT:  Discharge Medication List as of 10/22/2016 10:58 PM       Note:  This document was prepared using Dragon voice recognition software and may include unintentional dictation errors.     Merrily Brittle, MD 10/22/16 [phone]

## 2016-10-22 NOTE — ED Triage Notes (Signed)
Pt presents to ED via [facility] from home with c/o nausea and dizziness. Per EMS when patient sits up she gets dizzy. EMS reports that patient recently lost her husband and began taking Xanax. EMS reports that patient has not been drinking water today, that patient has been drinking coffee all day, also reports that family has been taking pt's BP at home with home BP cuff, reports systolic readings of 160 and up. Pt presents alert and oriented at this time.

## 2017-05-05 DIAGNOSIS — Z9289 Personal history of other medical treatment: Secondary | ICD-10-CM

## 2017-05-05 HISTORY — DX: Personal history of other medical treatment: Z92.89

## 2017-06-30 ENCOUNTER — Other Ambulatory Visit: Payer: Self-pay

## 2017-07-02 NOTE — Discharge Instructions (Signed)
Cirurgia de catarata, cuidados aps o procedimento Cataract Surgery, Care After Consulte este folheto nas prximas semanas. Essas instrues fornecem informaes gerais para os cuidados aps o seu procedimento. Seu mdico tambm poder fornecer instrues mais especficas. Seu tratamento foi planejado de acordo com as prticas mdicas atuais, mas s vezes problemas podem ocorrer. Ligue para seu mdico se tiver algum problema ou dvida aps o procedimento. O que posso esperar aps o procedimento? Aps seu procedimento,  comum que ocorram:  Coceira.  Desconforto.  Secreo de lquido.  Sensibilidade  luz e ao toque.  Mancha roxa.  Siga essas instrues em casa: Como cuidar do olho  Verifique o olho todos os dias em busca de sinais de infeco. Fique atento para: ? Vermelhido, inchao ou dor. ? Lquido, sangue e pus. ? Calor. ? Cheiro ruim. Atividades  Evite atividades extenuantes, como esportes de contato, pelo tempo determinado pelo seu mdico.  No dirija nem opere mquinas pesadas at obter aprovao do seu mdico.  No se incline nem levante objetos pesados. Inclinar-se aumenta a presso no olho. Voc pode andar, subir escadas e realizar tarefas domsticas leves.  Pergunte ao seu mdico quando voc poder retornar ao trabalho. Caso trabalhe em um ambiente empoeirado, poder ser aconselhvel usar um protetor ocular por algum tempo. Instrues gerais  Tome ou aplique medicamentos vendidos com ou sem receita mdica somente de acordo com as indicaes do seu mdico. Isso inclui colrios.  No toque nos olhos nem os esfregue.  Caso tenha recebido [employer], use-o de acordo com as orientaes do seu mdico. Caso no tenha recebido Hydrologist, use culos escuros de acordo com as orientaes do seu mdico para proteger os seus olhos.  Mantenha a rea ao redor do olho limpa e seca. Evite nadar ou permitir o contato direto de gua com seu rosto durante o banho pelo tempo  determinado pelo seu mdico. Mantenha sabonete e xampu [employer]  No coloque lentes de contato no olho ou nos olhos afetados at [employer] aprovao do seu mdico.  Comparea a todas as consultas de acompanhamento de acordo com as orientaes do seu mdico. Isso  importante. Entre em contato com um mdico se:   A mancha roxa ao redor do Careers adviser.  Medicamentos no aliviarem a dor.  Tiver febre.  Apresentar vermelhido, inchao ou dor nos olhos.  Apresentar secreo de lquido, sangue ou pus na inciso.  Sua viso piorar. Tomasa Hose ajuda imediatamente se:  Ocorrer perda sbita da viso. Estas informaes no se destinam a substituir as recomendaes de seu mdico. No deixe de discutir quaisquer dvidas com seu mdico. Document Released: 11/09/2008 Document Revised: 05/11/2016 Document Reviewed: 11/22/2014 Elsevier Interactive Patient Education  2018 Elsevier Inc. Anestesia general en los adultos, cuidados posteriores (General Anesthesia, Adult, Care After) Estas indicaciones le proporcionan informacin acerca de cmo deber cuidarse despus del procedimiento. El mdico tambin podr darle instrucciones ms especficas. El tratamiento ha sido planificado segn las prcticas mdicas actuales, pero en algunos casos pueden ocurrir problemas. Comunquese con el mdico si tiene algn problema o dudas despus del procedimiento. QU ESPERAR DESPUS DEL PROCEDIMIENTO Despus del procedimiento, es comn [employer] siguientes sntomas:  Vmitos.  Dolor de Advertising copywriter.  Lentitud mental. Es normal sentir lo siguiente:  Nuseas.  Fro o escalofros.  Somnolencia.  Cansancio.  Dolor o inflamacin, incluso en partes del cuerpo no afectadas por la ciruga. INSTRUCCIONES PARA EL CUIDADO EN EL HOGAR Durante al menos 24horas despus del procedimiento:  No haga lo siguiente: ? Participar en  actividades que impliquen posibles cadas o lesiones. ? Conducir  vehculos. ? Operar maquinarias pesadas. ? Beber alcohol. ? Tomar somnferos o medicamentos que causen somnolencia. ? Firmar documentos legales ni tomar Teachers Insurance and Annuity Associationdecisiones importantes. ? Cuidar a nios por su cuenta.  Hacer reposo. Comida y bebida  Si vomita, tome agua, jugo o sopa una vez que pueda beber sin vomitar.  Beba suficiente lquido para Photographermantener la orina clara o de color amarillo plido.  Asegrese de no tener nuseas antes de ingerir alimentos slidos.  Siga la dieta recomendada por el mdico. Instrucciones generales  Permanezca con un adulto responsable hasta que est completamente despierto y consciente.  Retome sus actividades normales como se lo haya indicado el mdico. Pregntele al mdico qu actividades son seguras para usted.  Tome los medicamentos de venta libre y los recetados solamente como se lo haya indicado el mdico.  Si fuma, no lo haga sin supervisin.  Concurra a todas las visitas de control como se lo haya indicado el mdico. Esto es importante. SOLICITE ATENCIN MDICA SI:  Contina con nuseas o vmitos en su casa, y los medicamentos no ayudan.  No puede beber lquidos ni volver a comer.  No puede orinar despus de 8 a 12horas.  Tiene una erupcin cutnea.  Tiene fiebre.  Tiene cada vez ms enrojecimiento en la zona de la ciruga. SOLICITE ATENCIN MDICA DE INMEDIATO SI:  Tiene dificultad para respirar.  Siente dolor en el pecho.  Tiene una hemorragia imprevista.  Siente que tiene un problema potencialmente mortal o urgente. Esta informacin no tiene Theme park managercomo fin reemplazar el consejo del mdico. Asegrese de hacerle al mdico cualquier pregunta que tenga. Document Released: 01/12/2005 Document Revised: 02/02/2014 Document Reviewed: 12/27/2014 Elsevier Interactive Patient Education  [employer]2018 Elsevier Inc.

## 2017-07-05 ENCOUNTER — Ambulatory Visit: Payer: Medicare (Managed Care) | Admitting: Anesthesiology

## 2017-07-05 ENCOUNTER — Encounter
Admission: RE | Disposition: A | Discharge status: Home / Self Care | Payer: Self-pay | Source: Ambulatory Visit | Attending: Ophthalmology

## 2017-07-05 ENCOUNTER — Ambulatory Visit
Admission: RE | Admit: 2017-07-05 | Discharge: 2017-07-05 | Disposition: A | Discharge status: Home / Self Care | Payer: Medicare (Managed Care) | Source: Ambulatory Visit | Attending: Ophthalmology | Admitting: Ophthalmology

## 2017-07-05 DIAGNOSIS — G2581 Restless legs syndrome: Secondary | ICD-10-CM | POA: Insufficient documentation

## 2017-07-05 DIAGNOSIS — Z79899 Other long term (current) drug therapy: Secondary | ICD-10-CM | POA: Insufficient documentation

## 2017-07-05 DIAGNOSIS — K219 Gastro-esophageal reflux disease without esophagitis: Secondary | ICD-10-CM | POA: Insufficient documentation

## 2017-07-05 DIAGNOSIS — Z7982 Long term (current) use of aspirin: Secondary | ICD-10-CM | POA: Insufficient documentation

## 2017-07-05 DIAGNOSIS — H2511 Age-related nuclear cataract, right eye: Secondary | ICD-10-CM | POA: Diagnosis not present

## 2017-07-05 DIAGNOSIS — J449 Chronic obstructive pulmonary disease, unspecified: Secondary | ICD-10-CM | POA: Diagnosis not present

## 2017-07-05 DIAGNOSIS — I10 Essential (primary) hypertension: Secondary | ICD-10-CM | POA: Insufficient documentation

## 2017-07-05 DIAGNOSIS — G4730 Sleep apnea, unspecified: Secondary | ICD-10-CM | POA: Diagnosis not present

## 2017-07-05 HISTORY — DX: Presence of dental prosthetic device (complete) (partial): Z97.2

## 2017-07-05 HISTORY — DX: Cardiac arrhythmia, unspecified: I49.9

## 2017-07-05 HISTORY — DX: Personal history of other medical treatment: Z92.89

## 2017-07-05 HISTORY — DX: Restless legs syndrome: G25.81

## 2017-07-05 HISTORY — DX: Sleep apnea, unspecified: G47.30

## 2017-07-05 HISTORY — DX: Unspecified osteoarthritis, unspecified site: M19.90

## 2017-07-05 HISTORY — PX: CATARACT EXTRACTION W/PHACO: SHX586

## 2017-07-05 HISTORY — DX: Angina pectoris, unspecified: I20.9

## 2017-07-05 HISTORY — DX: Dyspnea, unspecified: R06.00

## 2017-07-05 HISTORY — DX: Unspecified hearing loss, unspecified ear: H91.90

## 2017-07-05 HISTORY — DX: Headache: R51

## 2017-07-05 HISTORY — DX: Hepatomegaly, not elsewhere classified: R16.0

## 2017-07-05 HISTORY — DX: Gastro-esophageal reflux disease without esophagitis: K21.9

## 2017-07-05 HISTORY — DX: Anxiety disorder, unspecified: F41.9

## 2017-07-05 HISTORY — DX: Orthopnea: R06.01

## 2017-07-05 HISTORY — DX: Meniere's disease, unspecified ear: H81.09

## 2017-07-05 HISTORY — DX: Chronic obstructive pulmonary disease, unspecified: J44.9

## 2017-07-05 HISTORY — DX: Headache, unspecified: R51.9

## 2017-07-05 SURGERY — PHACOEMULSIFICATION, CATARACT, WITH IOL INSERTION
Anesthesia: Monitor Anesthesia Care | Site: Eye | Laterality: Right | Wound class: "Clean "

## 2017-07-05 MED ORDER — EPINEPHRINE PF 1 MG/ML IJ SOLN
INTRAOCULAR | Status: DC | PRN
  Administered 2017-07-05: 78 mL via OPHTHALMIC

## 2017-07-05 MED ORDER — ACETAMINOPHEN 160 MG/5ML PO SOLN: 325.0000 mg | Freq: Once | ORAL | Status: DC

## 2017-07-05 MED ORDER — FENTANYL CITRATE (PF) 100 MCG/2ML IJ SOLN
INTRAMUSCULAR | Status: DC | PRN
  Administered 2017-07-05: 50 ug via INTRAVENOUS

## 2017-07-05 MED ORDER — LACTATED RINGERS IV SOLN: INTRAVENOUS | Status: DC

## 2017-07-05 MED ORDER — NA HYALUR & NA CHOND-NA HYALUR 0.4-0.35 ML IO KIT
PACK | INTRAOCULAR | Status: DC | PRN
  Administered 2017-07-05: 1 mL via INTRAOCULAR

## 2017-07-05 MED ORDER — LACTATED RINGERS IV SOLN: 10.0000 mL/h | INTRAVENOUS | Status: DC

## 2017-07-05 MED ORDER — MIDAZOLAM HCL 2 MG/2ML IJ SOLN
INTRAMUSCULAR | Status: DC | PRN
  Administered 2017-07-05: 1 mg via INTRAVENOUS

## 2017-07-05 MED ORDER — ACETAMINOPHEN 325 MG PO TABS: 325.0000 mg | ORAL_TABLET | Freq: Once | ORAL | Status: DC

## 2017-07-05 MED ORDER — ARMC OPHTHALMIC DILATING DROPS
1.0000 "application " | OPHTHALMIC | Status: DC | PRN
  Administered 2017-07-05 (×2): 1 via OPHTHALMIC

## 2017-07-05 MED ORDER — MOXIFLOXACIN HCL 0.5 % OP SOLN
1.0000 [drp] | OPHTHALMIC | Status: DC | PRN
  Administered 2017-07-05 (×3): 1 [drp] via OPHTHALMIC

## 2017-07-05 MED ORDER — CEFUROXIME OPHTHALMIC INJECTION 1 MG/0.1 ML
INJECTION | OPHTHALMIC | Status: DC | PRN
  Administered 2017-07-05: .3 mL via OPHTHALMIC

## 2017-07-05 MED ORDER — BRIMONIDINE TARTRATE-TIMOLOL 0.2-0.5 % OP SOLN
OPHTHALMIC | Status: DC | PRN
  Administered 2017-07-05: 2 [drp] via OPHTHALMIC

## 2017-07-05 MED ORDER — LIDOCAINE HCL (PF) 2 % IJ SOLN
INTRAOCULAR | Status: DC | PRN
  Administered 2017-07-05: 1 mL via INTRAMUSCULAR

## 2017-07-05 SURGICAL SUPPLY — 28 items
CANNULA ANT/CHMB 27G (MISCELLANEOUS) ×1 IMPLANT
CANNULA ANT/CHMB 27GA (MISCELLANEOUS) ×3 IMPLANT
CARTRIDGE ABBOTT (MISCELLANEOUS) IMPLANT
GLOVE SURG LX 7.5 STRW (GLOVE) ×2
GLOVE SURG LX STRL 7.5 STRW (GLOVE) ×1 IMPLANT
GLOVE SURG TRIUMPH 8.0 PF LTX (GLOVE) ×3 IMPLANT
GOWN STRL REUS W/ TWL LRG LVL3 (GOWN DISPOSABLE) ×2 IMPLANT
GOWN STRL REUS W/TWL LRG LVL3 (GOWN DISPOSABLE) ×4
LENS IOL TECNIS ITEC 20.0 (Intraocular Lens) ×2 IMPLANT
MARKER SKIN DUAL TIP RULER LAB (MISCELLANEOUS) ×3 IMPLANT
NDL FILTER BLUNT 18X1 1/2 (NEEDLE) ×1 IMPLANT
NDL RETROBULBAR .5 NSTRL (NEEDLE) IMPLANT
NEEDLE FILTER BLUNT 18X 1/2SAF (NEEDLE) ×2
NEEDLE FILTER BLUNT 18X1 1/2 (NEEDLE) ×1 IMPLANT
PACK CATARACT BRASINGTON (MISCELLANEOUS) ×3 IMPLANT
PACK EYE AFTER SURG (MISCELLANEOUS) ×3 IMPLANT
PACK OPTHALMIC (MISCELLANEOUS) ×3 IMPLANT
RING MALYGIN 7.0 (MISCELLANEOUS) IMPLANT
SUT ETHILON 10-0 CS-B-6CS-B-6 (SUTURE)
SUT VICRYL  9 0 (SUTURE)
SUT VICRYL 9 0 (SUTURE) IMPLANT
SUTURE EHLN 10-0 CS-B-6CS-B-6 (SUTURE) IMPLANT
SYR 3ML LL SCALE MARK (SYRINGE) ×3 IMPLANT
SYR 5ML LL (SYRINGE) ×3 IMPLANT
SYR TB 1ML LUER SLIP (SYRINGE) ×3 IMPLANT
WATER STERILE IRR 500ML POUR (IV SOLUTION) ×3 IMPLANT
WICK EYE OCUCEL (MISCELLANEOUS) ×2 IMPLANT
WIPE NON LINTING 3.25X3.25 (MISCELLANEOUS) ×3 IMPLANT

## 2017-07-05 NOTE — H&P (Signed)
The History and Physical notes are on paper, have been signed, and are to be scanned. The patient remains stable and unchanged from the H&P.   Previous H&P reviewed, patient examined, and there are no changes.  Ann Welch 07/05/2017 9:54 AM   

## 2017-07-05 NOTE — Anesthesia Postprocedure Evaluation (Signed)
Anesthesia Post Note  Patient: Ann Welch  Procedure(s) Performed: CATARACT EXTRACTION PHACO AND INTRAOCULAR LENS PLACEMENT ([place]) RIGHT (Right Eye)  Patient location during evaluation: PACU Anesthesia Type: MAC Level of consciousness: awake and alert and oriented Pain management: satisfactory to patient Vital Signs Assessment: post-procedure vital signs reviewed and stable Respiratory status: spontaneous breathing, nonlabored ventilation and respiratory function stable Cardiovascular status: blood pressure returned to baseline and stable Postop Assessment: Adequate PO intake and No signs of nausea or vomiting Anesthetic complications: no    Raliegh Ip

## 2017-07-05 NOTE — Transfer of Care (Signed)
Immediate Anesthesia Transfer of Care Note  Patient: Ann Welch  Procedure(s) Performed: CATARACT EXTRACTION PHACO AND INTRAOCULAR LENS PLACEMENT ([facility]) RIGHT (Right Eye)  Patient Location: PACU  Anesthesia Type: MAC  Level of Consciousness: awake, alert  and patient cooperative  Airway and Oxygen Therapy: Patient Spontanous Breathing and Patient connected to supplemental oxygen  Post-op Assessment: Post-op Vital signs reviewed, Patient's Cardiovascular Status Stable, Respiratory Function Stable, Patent Airway and No signs of Nausea or vomiting  Post-op Vital Signs: Reviewed and stable  Complications: No apparent anesthesia complications

## 2017-07-05 NOTE — Anesthesia Preprocedure Evaluation (Signed)
Anesthesia Evaluation  Patient identified by MRN, date of birth, ID band Patient awake    Reviewed: Allergy & Precautions, H&P , NPO status , Patient's Chart, lab work & pertinent test results  Airway Mallampati: II  TM Distance: >3 FB Neck ROM: full    Dental no notable dental hx. (+) Partial Upper   Pulmonary sleep apnea , COPD,    Pulmonary exam normal breath sounds clear to auscultation       Cardiovascular hypertension, + angina Normal cardiovascular exam+ dysrhythmias  Rhythm:regular Rate:Normal     Neuro/Psych    GI/Hepatic GERD  ,  Endo/Other    Renal/GU      Musculoskeletal   Abdominal   Peds  Hematology   Anesthesia Other Findings   Reproductive/Obstetrics                             Anesthesia Physical Anesthesia Plan  ASA: III  Anesthesia Plan: MAC   Post-op Pain Management:    Induction:   PONV Risk Score and Plan: 2 and Treatment may vary due to age or medical condition and Midazolam  Airway Management Planned:   Additional Equipment:   Intra-op Plan:   Post-operative Plan:   Informed Consent: I have reviewed the patients History and Physical, chart, labs and discussed the procedure including the risks, benefits and alternatives for the proposed anesthesia with the patient or authorized representative who has indicated his/her understanding and acceptance.     Plan Discussed with: CRNA  Anesthesia Plan Comments:         Anesthesia Quick Evaluation

## 2017-07-05 NOTE — Op Note (Signed)
LOCATION:  [facility]   PREOPERATIVE DIAGNOSIS:    Nuclear sclerotic cataract right eye. H25.11   POSTOPERATIVE DIAGNOSIS:  Nuclear sclerotic cataract right eye.     PROCEDURE:  Phacoemusification with posterior chamber intraocular lens placement of the right eye   LENS:   Implant Name Type Inv. Item Serial No. Manufacturer Lot No. LRB No. Used  LENS IOL DIOP 20.0 - [id number] Intraocular Lens LENS IOL DIOP 20.0 [id number]  Right 1        ULTRASOUND TIME: 22 % of 1 minutes, 39 seconds.  CDE 22.1   SURGEON:  Deirdre Evenerhadwick R. Micholas Drumwright, MD   ANESTHESIA:  Topical with tetracaine drops and 2% Xylocaine jelly, augmented with 1% preservative-free intracameral lidocaine.    COMPLICATIONS:  None.   DESCRIPTION OF PROCEDURE:  The patient was identified in the holding room and transported to the operating room and placed in the supine position under the operating microscope.  The right eye was identified as the operative eye and it was prepped and draped in the usual sterile ophthalmic fashion.   A 1 millimeter clear-corneal paracentesis was made at the 12:00 position.  0.5 ml of preservative-free 1% lidocaine was injected into the anterior chamber. The anterior chamber was filled with Viscoat viscoelastic.  A 2.4 millimeter keratome was used to make a near-clear corneal incision at the 9:00 position.  A curvilinear capsulorrhexis was made with a cystotome and capsulorrhexis forceps.  Balanced salt solution was used to hydrodissect and hydrodelineate the nucleus.   Phacoemulsification was then used in stop and chop fashion to remove the lens nucleus and epinucleus.  The remaining cortex was then removed using the irrigation and aspiration handpiece. Provisc was then placed into the capsular bag to distend it for lens placement.  A lens was then injected into the capsular bag.  The remaining viscoelastic was aspirated.   Wounds were hydrated with balanced salt solution.  The anterior  chamber was inflated to a physiologic pressure with balanced salt solution.  No wound leaks were noted. Cefuroxime 0.1 ml of a 10mg /ml solution was injected into the anterior chamber for a dose of 1 mg of intracameral antibiotic at the completion of the case.   Timolol and Brimonidine drops were applied to the eye.  The patient was taken to the recovery room in stable condition without complications of anesthesia or surgery.   Ann Welch 07/05/2017, 11:19 AM

## 2017-07-05 NOTE — Anesthesia Procedure Notes (Signed)
Procedure Name: MAC Date/Time: 07/05/2017 10:59 AM Performed by: Cameron Ali, CRNA Pre-anesthesia Checklist: Patient identified, Emergency Drugs available, Suction available, Timeout performed and Patient being monitored Patient Re-evaluated:Patient Re-evaluated prior to induction Oxygen Delivery Method: Nasal cannula Placement Confirmation: positive ETCO2

## 2017-07-06 ENCOUNTER — Encounter: Payer: Self-pay | Admitting: Ophthalmology

## 2017-07-19 ENCOUNTER — Other Ambulatory Visit: Payer: Self-pay | Admitting: Family

## 2017-07-19 ENCOUNTER — Ambulatory Visit
Admission: RE | Admit: 2017-07-19 | Discharge: 2017-07-19 | Disposition: A | Discharge status: Home / Self Care | Payer: Medicare (Managed Care) | Source: Ambulatory Visit | Attending: Family | Admitting: Family

## 2017-07-19 DIAGNOSIS — M25551 Pain in right hip: Secondary | ICD-10-CM

## 2017-09-30 ENCOUNTER — Other Ambulatory Visit: Payer: Self-pay

## 2017-09-30 ENCOUNTER — Encounter: Payer: Self-pay | Admitting: *Deleted

## 2017-09-30 NOTE — Discharge Instructions (Signed)

## 2017-10-06 ENCOUNTER — Ambulatory Visit
Admission: RE | Admit: 2017-10-06 | Discharge: 2017-10-06 | Disposition: A | Discharge status: Home / Self Care | Payer: Medicare (Managed Care) | Source: Ambulatory Visit | Attending: Ophthalmology | Admitting: Ophthalmology

## 2017-10-06 ENCOUNTER — Encounter
Admission: RE | Disposition: A | Discharge status: Home / Self Care | Payer: Self-pay | Source: Ambulatory Visit | Attending: Ophthalmology

## 2017-10-06 ENCOUNTER — Ambulatory Visit: Payer: Medicare (Managed Care) | Admitting: Anesthesiology

## 2017-10-06 DIAGNOSIS — M1990 Unspecified osteoarthritis, unspecified site: Secondary | ICD-10-CM | POA: Diagnosis not present

## 2017-10-06 DIAGNOSIS — J449 Chronic obstructive pulmonary disease, unspecified: Secondary | ICD-10-CM | POA: Diagnosis not present

## 2017-10-06 DIAGNOSIS — G4730 Sleep apnea, unspecified: Secondary | ICD-10-CM | POA: Insufficient documentation

## 2017-10-06 DIAGNOSIS — F319 Bipolar disorder, unspecified: Secondary | ICD-10-CM | POA: Diagnosis not present

## 2017-10-06 DIAGNOSIS — Z79899 Other long term (current) drug therapy: Secondary | ICD-10-CM | POA: Diagnosis not present

## 2017-10-06 DIAGNOSIS — K219 Gastro-esophageal reflux disease without esophagitis: Secondary | ICD-10-CM | POA: Diagnosis not present

## 2017-10-06 DIAGNOSIS — G2581 Restless legs syndrome: Secondary | ICD-10-CM | POA: Insufficient documentation

## 2017-10-06 DIAGNOSIS — H2512 Age-related nuclear cataract, left eye: Secondary | ICD-10-CM | POA: Insufficient documentation

## 2017-10-06 DIAGNOSIS — I209 Angina pectoris, unspecified: Secondary | ICD-10-CM | POA: Insufficient documentation

## 2017-10-06 DIAGNOSIS — I10 Essential (primary) hypertension: Secondary | ICD-10-CM | POA: Diagnosis not present

## 2017-10-06 HISTORY — PX: CATARACT EXTRACTION W/PHACO: SHX586

## 2017-10-06 SURGERY — PHACOEMULSIFICATION, CATARACT, WITH IOL INSERTION
Anesthesia: Monitor Anesthesia Care | Site: Eye | Laterality: Left

## 2017-10-06 MED ORDER — FENTANYL CITRATE (PF) 100 MCG/2ML IJ SOLN
INTRAMUSCULAR | Status: DC | PRN
  Administered 2017-10-06: 50 ug via INTRAVENOUS

## 2017-10-06 MED ORDER — CEFUROXIME OPHTHALMIC INJECTION 1 MG/0.1 ML
INJECTION | OPHTHALMIC | Status: DC | PRN
  Administered 2017-10-06: 0.1 mL via OPHTHALMIC

## 2017-10-06 MED ORDER — EPINEPHRINE PF 1 MG/ML IJ SOLN
INTRAOCULAR | Status: DC | PRN
  Administered 2017-10-06: 73 mL via OPHTHALMIC

## 2017-10-06 MED ORDER — LACTATED RINGERS IV SOLN: 10.0000 mL/h | INTRAVENOUS | Status: DC

## 2017-10-06 MED ORDER — MOXIFLOXACIN HCL 0.5 % OP SOLN
1.0000 [drp] | OPHTHALMIC | Status: DC | PRN
  Administered 2017-10-06 (×3): 1 [drp] via OPHTHALMIC

## 2017-10-06 MED ORDER — NA HYALUR & NA CHOND-NA HYALUR 0.4-0.35 ML IO KIT
PACK | INTRAOCULAR | Status: DC | PRN
  Administered 2017-10-06: 1 mL via INTRAOCULAR

## 2017-10-06 MED ORDER — MIDAZOLAM HCL 2 MG/2ML IJ SOLN
INTRAMUSCULAR | Status: DC | PRN
  Administered 2017-10-06: 1 mg via INTRAVENOUS

## 2017-10-06 MED ORDER — ARMC OPHTHALMIC DILATING DROPS
1.0000 "application " | OPHTHALMIC | Status: DC | PRN
  Administered 2017-10-06 (×3): 1 via OPHTHALMIC

## 2017-10-06 MED ORDER — BRIMONIDINE TARTRATE-TIMOLOL 0.2-0.5 % OP SOLN
OPHTHALMIC | Status: DC | PRN
  Administered 2017-10-06: 1 [drp] via OPHTHALMIC

## 2017-10-06 MED ORDER — LIDOCAINE HCL (PF) 2 % IJ SOLN
INTRAOCULAR | Status: DC | PRN
  Administered 2017-10-06: .5 mL

## 2017-10-06 MED ORDER — ONDANSETRON HCL 4 MG/2ML IJ SOLN: 4.0000 mg | Freq: Once | INTRAMUSCULAR | Status: DC | PRN

## 2017-10-06 SURGICAL SUPPLY — 25 items
CANNULA ANT/CHMB 27GA (MISCELLANEOUS) ×3 IMPLANT
CARTRIDGE ABBOTT (MISCELLANEOUS) IMPLANT
GLOVE SURG LX 7.5 STRW (GLOVE) ×2
GLOVE SURG LX STRL 7.5 STRW (GLOVE) ×1 IMPLANT
GLOVE SURG TRIUMPH 8.0 PF LTX (GLOVE) ×3 IMPLANT
GOWN STRL REUS W/ TWL LRG LVL3 (GOWN DISPOSABLE) ×2 IMPLANT
GOWN STRL REUS W/TWL LRG LVL3 (GOWN DISPOSABLE) ×4
LENS IOL TECNIS ITEC 21.0 (Intraocular Lens) ×3 IMPLANT
MARKER SKIN DUAL TIP RULER LAB (MISCELLANEOUS) ×3 IMPLANT
NDL RETROBULBAR .5 NSTRL (NEEDLE) IMPLANT
NEEDLE FILTER BLUNT 18X 1/2SAF (NEEDLE) ×2
NEEDLE FILTER BLUNT 18X1 1/2 (NEEDLE) ×1 IMPLANT
PACK CATARACT BRASINGTON (MISCELLANEOUS) ×3 IMPLANT
PACK EYE AFTER SURG (MISCELLANEOUS) ×3 IMPLANT
PACK OPTHALMIC (MISCELLANEOUS) ×3 IMPLANT
RING MALYGIN 7.0 (MISCELLANEOUS) IMPLANT
SUT ETHILON 10-0 CS-B-6CS-B-6 (SUTURE)
SUT VICRYL  9 0 (SUTURE)
SUT VICRYL 9 0 (SUTURE) IMPLANT
SUTURE EHLN 10-0 CS-B-6CS-B-6 (SUTURE) IMPLANT
SYR 3ML LL SCALE MARK (SYRINGE) ×3 IMPLANT
SYR 5ML LL (SYRINGE) ×3 IMPLANT
SYR TB 1ML LUER SLIP (SYRINGE) ×3 IMPLANT
WATER STERILE IRR 500ML POUR (IV SOLUTION) ×3 IMPLANT
WIPE NON LINTING 3.25X3.25 (MISCELLANEOUS) ×3 IMPLANT

## 2017-10-06 NOTE — Anesthesia Procedure Notes (Signed)
Procedure Name: MAC Date/Time: 10/06/2017 7:36 AM Performed by: Janna Arch, CRNA Pre-anesthesia Checklist: Patient identified, Emergency Drugs available, Suction available, Timeout performed and Patient being monitored Patient Re-evaluated:Patient Re-evaluated prior to induction Oxygen Delivery Method: Nasal cannula Placement Confirmation: positive ETCO2

## 2017-10-06 NOTE — Anesthesia Postprocedure Evaluation (Signed)
Anesthesia Post Note  Patient: Ann Welch  Procedure(s) Performed: CATARACT EXTRACTION PHACO AND INTRAOCULAR LENS PLACEMENT (IOC)  LEFT PRE DIABETIC (Left Eye)  Patient location during evaluation: PACU Anesthesia Type: MAC Level of consciousness: awake and alert Pain management: pain level controlled Vital Signs Assessment: post-procedure vital signs reviewed and stable Respiratory status: spontaneous breathing, nonlabored ventilation, respiratory function stable and patient connected to nasal cannula oxygen Cardiovascular status: stable and blood pressure returned to baseline Postop Assessment: no apparent nausea or vomiting Anesthetic complications: no    Veda Canning

## 2017-10-06 NOTE — Transfer of Care (Signed)
Immediate Anesthesia Transfer of Care Note  Patient: Ann Welch  Procedure(s) Performed: CATARACT EXTRACTION PHACO AND INTRAOCULAR LENS PLACEMENT (IOC)  LEFT PRE DIABETIC (Left Eye)  Patient Location: PACU  Anesthesia Type: MAC  Level of Consciousness: awake, alert  and patient cooperative  Airway and Oxygen Therapy: Patient Spontanous Breathing and Patient connected to supplemental oxygen  Post-op Assessment: Post-op Vital signs reviewed, Patient's Cardiovascular Status Stable, Respiratory Function Stable, Patent Airway and No signs of Nausea or vomiting  Post-op Vital Signs: Reviewed and stable  Complications: No apparent anesthesia complications

## 2017-10-06 NOTE — Anesthesia Preprocedure Evaluation (Signed)
Anesthesia Evaluation  Patient identified by MRN, date of birth, ID band Patient awake    Reviewed: Allergy & Precautions, H&P , NPO status   Airway Mallampati: II  TM Distance: >3 FB Neck ROM: full    Dental  (+) Partial Upper   Pulmonary sleep apnea , COPD,    breath sounds clear to auscultation       Cardiovascular hypertension, + angina + dysrhythmias  Rhythm:regular Rate:Normal     Neuro/Psych    GI/Hepatic GERD  ,  Endo/Other    Renal/GU      Musculoskeletal  (+) Arthritis ,   Abdominal   Peds  Hematology   Anesthesia Other Findings   Reproductive/Obstetrics                             Anesthesia Physical  Anesthesia Plan  ASA: III  Anesthesia Plan: MAC   Post-op Pain Management:    Induction:   PONV Risk Score and Plan: 2 and Treatment may vary due to age or medical condition and Midazolam  Airway Management Planned:   Additional Equipment:   Intra-op Plan:   Post-operative Plan:   Informed Consent: I have reviewed the patients History and Physical, chart, labs and discussed the procedure including the risks, benefits and alternatives for the proposed anesthesia with the patient or authorized representative who has indicated his/her understanding and acceptance.     Plan Discussed with: CRNA  Anesthesia Plan Comments:         Anesthesia Quick Evaluation

## 2017-10-06 NOTE — Op Note (Signed)
OPERATIVE NOTE  Youstina Supplee [id number] 10/06/2017   PREOPERATIVE DIAGNOSIS:  Nuclear sclerotic cataract left eye. H25.12   POSTOPERATIVE DIAGNOSIS:    Nuclear sclerotic cataract left eye.     PROCEDURE:  Phacoemusification with posterior chamber intraocular lens placement of the left eye   LENS:   Implant Name Type Inv. Item Serial No. Manufacturer Lot No. LRB No. Used  LENS IOL DIOP 21.0 - [id number] Intraocular Lens LENS IOL DIOP 21.0 [id number]  Left 1        ULTRASOUND TIME: 20  % of 1 minutes 36 seconds, CDE 19.0  SURGEON:  Deirdre Evener, MD   ANESTHESIA:  Topical with tetracaine drops and 2% Xylocaine jelly, augmented with 1% preservative-free intracameral lidocaine.    COMPLICATIONS:  None.   DESCRIPTION OF PROCEDURE:  The patient was identified in the holding room and transported to the operating room and placed in the supine position under the operating microscope.  The left eye was identified as the operative eye and it was prepped and draped in the usual sterile ophthalmic fashion.   A 1 millimeter clear-corneal paracentesis was made at the 1:30 position.  0.5 ml of preservative-free 1% lidocaine was injected into the anterior chamber.  The anterior chamber was filled with Viscoat viscoelastic.  A 2.4 millimeter keratome was used to make a near-clear corneal incision at the 10:30 position.  .  A curvilinear capsulorrhexis was made with a cystotome and capsulorrhexis forceps.  Balanced salt solution was used to hydrodissect and hydrodelineate the nucleus.   Phacoemulsification was then used in stop and chop fashion to remove the lens nucleus and epinucleus.  The remaining cortex was then removed using the irrigation and aspiration handpiece. Provisc was then placed into the capsular bag to distend it for lens placement.  A lens was then injected into the capsular bag.  The remaining viscoelastic was aspirated.   Wounds were hydrated with balanced salt  solution.  The anterior chamber was inflated to a physiologic pressure with balanced salt solution.  No wound leaks were noted. Cefuroxime 0.1 ml of a 10mg /ml solution was injected into the anterior chamber for a dose of 1 mg of intracameral antibiotic at the completion of the case.   Timolol and Brimonidine drops were applied to the eye.  The patient was taken to the recovery room in stable condition without complications of anesthesia or surgery.  Brodi Nery 10/06/2017, 7:59 AM

## 2017-10-06 NOTE — H&P (Signed)
The History and Physical notes are on paper, have been signed, and are to be scanned. The patient remains stable and unchanged from the H&P.   Previous H&P reviewed, patient examined, and there are no changes.  Mikle Sternberg 10/06/2017 7:34 AM

## 2017-10-07 ENCOUNTER — Encounter: Payer: Self-pay | Admitting: Ophthalmology

## 2017-12-28 ENCOUNTER — Emergency Department: Payer: Medicare (Managed Care)

## 2017-12-28 ENCOUNTER — Encounter: Payer: Self-pay | Admitting: Emergency Medicine

## 2017-12-28 ENCOUNTER — Observation Stay
Admission: EM | Admit: 2017-12-28 | Discharge: 2017-12-29 | Disposition: A | Discharge status: Home / Self Care | Payer: Medicare (Managed Care) | Attending: Internal Medicine | Admitting: Internal Medicine

## 2017-12-28 ENCOUNTER — Other Ambulatory Visit: Payer: Self-pay

## 2017-12-28 DIAGNOSIS — I10 Essential (primary) hypertension: Secondary | ICD-10-CM | POA: Diagnosis not present

## 2017-12-28 DIAGNOSIS — Z79899 Other long term (current) drug therapy: Secondary | ICD-10-CM | POA: Insufficient documentation

## 2017-12-28 DIAGNOSIS — Z7982 Long term (current) use of aspirin: Secondary | ICD-10-CM | POA: Diagnosis not present

## 2017-12-28 DIAGNOSIS — R079 Chest pain, unspecified: Secondary | ICD-10-CM | POA: Diagnosis present

## 2017-12-28 DIAGNOSIS — G2581 Restless legs syndrome: Secondary | ICD-10-CM | POA: Insufficient documentation

## 2017-12-28 DIAGNOSIS — F419 Anxiety disorder, unspecified: Secondary | ICD-10-CM | POA: Insufficient documentation

## 2017-12-28 DIAGNOSIS — I471 Supraventricular tachycardia: Secondary | ICD-10-CM | POA: Diagnosis not present

## 2017-12-28 DIAGNOSIS — R0789 Other chest pain: Secondary | ICD-10-CM | POA: Diagnosis not present

## 2017-12-28 DIAGNOSIS — K219 Gastro-esophageal reflux disease without esophagitis: Secondary | ICD-10-CM | POA: Diagnosis not present

## 2017-12-28 DIAGNOSIS — I444 Left anterior fascicular block: Secondary | ICD-10-CM | POA: Diagnosis not present

## 2017-12-28 DIAGNOSIS — I4891 Unspecified atrial fibrillation: Secondary | ICD-10-CM | POA: Insufficient documentation

## 2017-12-28 DIAGNOSIS — Z8249 Family history of ischemic heart disease and other diseases of the circulatory system: Secondary | ICD-10-CM | POA: Diagnosis not present

## 2017-12-28 DIAGNOSIS — H8109 Meniere's disease, unspecified ear: Secondary | ICD-10-CM | POA: Insufficient documentation

## 2017-12-28 DIAGNOSIS — J449 Chronic obstructive pulmonary disease, unspecified: Secondary | ICD-10-CM | POA: Insufficient documentation

## 2017-12-28 DIAGNOSIS — J9811 Atelectasis: Secondary | ICD-10-CM | POA: Diagnosis not present

## 2017-12-28 DIAGNOSIS — R7989 Other specified abnormal findings of blood chemistry: Secondary | ICD-10-CM | POA: Diagnosis not present

## 2017-12-28 DIAGNOSIS — G4733 Obstructive sleep apnea (adult) (pediatric): Secondary | ICD-10-CM | POA: Insufficient documentation

## 2017-12-28 LAB — BASIC METABOLIC PANEL
ANION GAP: 6 (ref 5–15)
BUN: 13 mg/dL (ref 8–23)
CALCIUM: 9.3 mg/dL (ref 8.9–10.3)
CO2: 26 mmol/L (ref 22–32)
CREATININE: 0.47 mg/dL (ref 0.44–1.00)
Chloride: 102 mmol/L (ref 98–111)
Glucose, Bld: 116 mg/dL — ABNORMAL HIGH (ref 70–99)
Potassium: 4.9 mmol/L (ref 3.5–5.1)
Sodium: 134 mmol/L — ABNORMAL LOW (ref 135–145)

## 2017-12-28 LAB — CBC
HCT: 40.1 % (ref 36.0–46.0)
Hemoglobin: 13.3 g/dL (ref 12.0–15.0)
MCH: 30.6 pg (ref 26.0–34.0)
MCHC: 33.2 g/dL (ref 30.0–36.0)
MCV: 92.4 fL (ref 80.0–100.0)
NRBC: 0 % (ref 0.0–0.2)
PLATELETS: 175 10*3/uL (ref 150–400)
RBC: 4.34 MIL/uL (ref 3.87–5.11)
RDW: 12.4 % (ref 11.5–15.5)
WBC: 6.5 10*3/uL (ref 4.0–10.5)

## 2017-12-28 LAB — TROPONIN I: TROPONIN I: 0.04 ng/mL — AB (ref <0.03)

## 2017-12-28 LAB — TSH: TSH: 1.744 u[IU]/mL (ref 0.350–4.500)

## 2017-12-28 NOTE — ED Provider Notes (Signed)
[facility] Emergency Department Provider Note   ____________________________________________   First MD Initiated Contact with Patient 12/28/17 2153     (approximate)  I have reviewed the triage vital signs and the nursing notes.   HISTORY  Chief Complaint Chest Pain and Dizziness    HPI Ann Welch is a 84 y.o. female who called EMS because her heart was racing.  EMS reports heart rate was 180 when they got there.  They gave her some IV metoprolol and her heart rate normalized.  Patient had chest pain at that time now but the chest pain is gone and she feels much better.  She also reports that she fell a few months ago about 5 months ago landed on her left side but her right hip is been hurting ever since.  It hurts worse when she walks around.  Her she says that her doctor told her to lay in bed and not move.  Patient had a similar episode of tachycardia in [place] she spent 2 days in the hospital had a cath which she was told was normal.   Past Medical History:  Diagnosis Date  . Anginal pain (HCC)   . Anxiety   . Arthritis   . COPD (chronic obstructive pulmonary disease) (HCC)   . Dyspnea   . Dysrhythmia    Hx of A-Fib  . GERD (gastroesophageal reflux disease)   . Headache    1/week  . History of recent hospitalization 05/05/2017   In [place] for low BP and angina with Heart Cath, no significant findings, Surgical clearance in chart [facility]  . HOH (hard of hearing)    right ear  . Hypertension   . Liver mass   . Meniere disease   . Orthopnea   . Restless leg syndrome   . Sleep apnea    CPAP  . Wears dentures    upper    There are no active problems to display for this patient.   Past Surgical History:  Procedure Laterality Date  . BREAST BIOPSY    . CARDIAC CATHETERIZATION    . CATARACT EXTRACTION W/PHACO Right 07/05/2017   Procedure: CATARACT EXTRACTION PHACO AND INTRAOCULAR LENS PLACEMENT (IOC)  RIGHT;  Surgeon: Lockie Mola, MD;  Location: [facility];  Service: Ophthalmology;  Laterality: Right;  Sleep Apnea-CPAP Spanish speaking-understands English  . CATARACT EXTRACTION W/PHACO Left 10/06/2017   Procedure: CATARACT EXTRACTION PHACO AND INTRAOCULAR LENS PLACEMENT (IOC)  LEFT PRE DIABETIC;  Surgeon: Lockie Mola, MD;  Location: [facility];  Service: Ophthalmology;  Laterality: Left;  . COLONOSCOPY      Prior to Admission medications   Medication Sig Start Date End Date Taking? Authorizing Provider  albuterol (PROVENTIL) (2.5 MG/3ML) 0.083% nebulizer solution Take 3 mLs (2.5 mg total) by nebulization every 4 (four) hours as needed for wheezing or shortness of breath. Patient not taking: Reported on 06/30/2017 12/20/14   Irean Hong, MD  alum & mag hydroxide-simeth (MAALOX/MYLANTA) 200-200-20 MG/5ML suspension Take by mouth every 6 (six) hours as needed for indigestion or heartburn.    [provider]  aspirin EC 81 MG tablet Take 81 mg by mouth daily. pm    [provider]  butalbital-acetaminophen-caffeine (FIORICET, ESGIC) 50-325-40 MG tablet as needed.  06/18/16   [provider]  chlorpheniramine-HYDROcodone (TUSSIONEX PENNKINETIC ER) 10-8 MG/5ML SUER Take 5 mLs by mouth 2 (two) times daily. Patient not taking: Reported on 10/22/2016 12/20/14   Dolores Frame,  [place], MD  cholecalciferol (VITAMIN D) 1000 units tablet Take 1,000 Units by mouth daily.    [provider]  diltiazem (CARDIZEM) 60 MG tablet Take 1 tablet (60 mg total) by mouth 2 (two) times daily. Patient taking differently: Take 30 mg by mouth daily. am 07/09/16 09/30/17  Emily Filbert, MD  meclizine (ANTIVERT) 25 MG tablet Take 25 mg by mouth 3 (three) times daily as needed for dizziness.    [provider]  Melatonin 5 MG TABS Take 2 tablets by mouth at bedtime.     [provider]  metoprolol succinate (TOPROL-XL) 25 MG 24 hr tablet Take 25  mg by mouth at bedtime.    [provider]  Multiple Vitamins-Minerals (CENTRUM SILVER 50+WOMEN PO) Take by mouth.    [provider]  nitroGLYCERIN (NITROSTAT) 0.4 MG SL tablet Place 0.4 mg under the tongue every 5 (five) minutes as needed for chest pain.    [provider]  polyethylene glycol (MIRALAX / GLYCOLAX) packet Take 17 g by mouth daily.    [provider]  ranitidine (ZANTAC) 150 MG capsule Take 150 mg by mouth daily. am    [provider]  vitamin E 400 UNIT capsule Take 400 Units by mouth daily.    [provider]    Allergies Patient has no known allergies.  History reviewed. No pertinent family history.  Social History Social History   Tobacco Use  . Smoking status: Never Smoker  . Smokeless tobacco: Never Used  Substance Use Topics  . Alcohol use: No  . Drug use: No    Review of Systems  Constitutional: No fever/chills Eyes: No visual changes. ENT: No sore throat. Cardiovascular: Denies chest pain currently. Respiratory: Denies shortness of breath. Gastrointestinal: No abdominal pain.  No nausea, no vomiting.  No diarrhea.  No constipation. Genitourinary: Negative for dysuria. Musculoskeletal: Negative for back pain. Skin: Negative for rash. Neurological: Negative for headaches, focal weakness or   ____________________________________________   PHYSICAL EXAM:  VITAL SIGNS: ED Triage Vitals  Enc Vitals Group     BP 12/28/17 2147 123/77     Pulse Rate 12/28/17 2147 79     Resp 12/28/17 2147 (!) 22     Temp 12/28/17 2147 97.9 F (36.6 C)     Temp Source 12/28/17 2147 Oral     SpO2 12/28/17 2147 94 %     Weight 12/28/17 2149 135 lb (61.2 kg)     Height 12/28/17 2149 5\' 4"  (1.626 m)     Head Circumference --      Peak Flow --      Pain Score 12/28/17 2149 0     Pain Loc --      Pain Edu? --      Excl. in GC? --     Constitutional: Alert and oriented. Well appearing and in no acute  distress. Eyes: Conjunctivae are normal.  Head: Atraumatic. Nose: No congestion/rhinnorhea. Mouth/Throat: Mucous membranes are moist.  Oropharynx non-erythematous. Neck: No stridor.  Cardiovascular: Normal rate, regular rhythm. Grossly normal heart sounds.  Good peripheral circulation. Respiratory: Normal respiratory effort.  No retractions. Lungs CTAB. Gastrointestinal: Soft and nontender. No distention. No abdominal bruits. No CVA tenderness. Musculoskeletal: No lower extremity tenderness except for mild tenderness in the right hip no edema Neurologic:  Normal speech and language. No gross focal neurologic deficits are appreciated.  Skin:  Skin is warm, dry and intact. No rash noted. Psychiatric: Mood and affect are normal. Speech  and behavior are normal.  ____________________________________________   LABS (all labs ordered are listed, but only abnormal results are displayed)  Labs Reviewed  BASIC METABOLIC PANEL - Abnormal; Notable for the following components:      Result Value   Sodium 134 (*)    Glucose, Bld 116 (*)    All other components within normal limits  TROPONIN I - Abnormal; Notable for the following components:   Troponin I 0.04 (*)    All other components within normal limits  CBC  TSH  TROPONIN I   ____________________________________________  EKG  EKG read and interpreted by me shows normal sinus rhythm rate of 78 left axis left anterior hemiblock per the computer no acute ST-T wave changes ____________________________________________  RADIOLOGY  ED MD interpretation:    Official radiology report(s): Dg Chest 2 View  Result Date: 12/28/2017 CLINICAL DATA:  Chest pain and dizziness.  History of COPD. EXAM: CHEST - 2 VIEW COMPARISON:  Chest radiograph October 22, 2016 FINDINGS: Cardiomediastinal silhouette is normal. No pleural effusions or focal consolidations. Mild bronchitic changes. Bibasilar strandy densities. Trachea projects midline and there  is no pneumothorax. Soft tissue planes and included osseous structures are non-suspicious. Mild approximate L1 compression fracture. Osteopenia. IMPRESSION: Mild bronchitic changes without focal consolidation. Bibasilar atelectasis. Electronically Signed   By: Awilda Metroourtnay  Bloomer M.D.   On: 12/28/2017 22:24   Dg Hip Unilat W Or Wo Pelvis 2-3 Views Right  Result Date: 12/28/2017 CLINICAL DATA:  RIGHT hip pain for 5 months, status post fall. EXAM: DG HIP (WITH OR WITHOUT PELVIS) 2-3V RIGHT COMPARISON:  None. FINDINGS: There is no evidence of hip fracture or dislocation. Osteopenia. There is no evidence of arthropathy or other focal bone abnormality. Phleboliths project in the pelvis. IMPRESSION: Negative. Electronically Signed   By: Awilda Metroourtnay  Bloomer M.D.   On: 12/28/2017 22:25    ____________________________________________   PROCEDURES  Procedure(s) performed:   Procedures  Critical Care performed:   ____________________________________________   INITIAL IMPRESSION / ASSESSMENT AND PLAN / ED COURSE   Patient doing well initial troponin is slightly elevated we will repeat it to make sure it has not jumped a whole lot if not plan on discharging her with follow-up with cardiology        ____________________________________________   FINAL CLINICAL IMPRESSION(S) / ED DIAGNOSES  Final diagnoses:  SVT (supraventricular tachycardia) [facility](HCC)     ED Discharge Orders    None       Note:  This document was prepared using Dragon voice recognition software and may include unintentional dictation errors.    Arnaldo NatalMalinda, Prateek Knipple F, MD 12/28/17 [phone]

## 2017-12-28 NOTE — ED Triage Notes (Signed)
Pt brought into the ED via [facility] with complaints of chest pain, EMS states that on scene her HR was 180, they gave her 5mg  of Metoprolol and her heart rate came down to normal. Her chest pain went away. She state that she is feeling dizzy now.

## 2017-12-28 NOTE — Discharge Instructions (Addendum)
Please return if you feel worse.  Please call Dr. Welton FlakesKhan the cardiologist, in the morning and set up a follow-up appointment.  You can also call Dr. Lady GaryFath to be seen before.  Please be sure to follow-up with 1 of these 2 cardiologists.  Please return here if you are worse.

## 2017-12-28 NOTE — ED Notes (Signed)
Dr Darnelle CatalanMalinda made aware of pt's elevated troponin level as reported by lab at this time. Troponin 0.04ng/mL

## 2017-12-29 DIAGNOSIS — R079 Chest pain, unspecified: Secondary | ICD-10-CM | POA: Diagnosis present

## 2017-12-29 LAB — TROPONIN I
Troponin I: 0.06 ng/mL (ref <0.03)
Troponin I: 0.07 ng/mL (ref <0.03)

## 2017-12-29 MED ORDER — FAMOTIDINE 20 MG PO TABS
20.0000 mg | ORAL_TABLET | Freq: Every day | ORAL | Status: DC
  Administered 2017-12-29: 20 mg via ORAL
  Filled 2017-12-29: qty 1

## 2017-12-29 MED ORDER — ASPIRIN EC 81 MG PO TBEC: 81.0000 mg | DELAYED_RELEASE_TABLET | Freq: Every day | ORAL | Status: DC

## 2017-12-29 MED ORDER — DILTIAZEM HCL 60 MG PO TABS: 30.0000 mg | ORAL_TABLET | Freq: Every day | ORAL | Status: DC

## 2017-12-29 MED ORDER — ENOXAPARIN SODIUM 40 MG/0.4ML ~~LOC~~ SOLN
40.0000 mg | SUBCUTANEOUS | Status: DC
  Administered 2017-12-29: 40 mg via SUBCUTANEOUS
  Filled 2017-12-29: qty 0.4

## 2017-12-29 MED ORDER — METOPROLOL SUCCINATE ER 50 MG PO TB24: 25.0000 mg | ORAL_TABLET | Freq: Every day | ORAL | Status: DC

## 2017-12-29 MED ORDER — ONDANSETRON HCL 4 MG PO TABS: 4.0000 mg | ORAL_TABLET | Freq: Four times a day (QID) | ORAL | Status: DC | PRN

## 2017-12-29 MED ORDER — ACETAMINOPHEN 650 MG RE SUPP: 650.0000 mg | Freq: Four times a day (QID) | RECTAL | Status: DC | PRN

## 2017-12-29 MED ORDER — ACETAMINOPHEN 325 MG PO TABS: 650.0000 mg | ORAL_TABLET | Freq: Four times a day (QID) | ORAL | Status: DC | PRN

## 2017-12-29 MED ORDER — DILTIAZEM HCL 60 MG PO TABS
30.0000 mg | ORAL_TABLET | Freq: Every morning | ORAL | Status: DC
  Administered 2017-12-29: 30 mg via ORAL
  Filled 2017-12-29: qty 1

## 2017-12-29 MED ORDER — NITROGLYCERIN 0.4 MG SL SUBL: 0.4000 mg | SUBLINGUAL_TABLET | SUBLINGUAL | Status: DC | PRN

## 2017-12-29 MED ORDER — MELATONIN 5 MG PO TABS
2.0000 | ORAL_TABLET | Freq: Every day | ORAL | Status: DC
  Filled 2017-12-29: qty 2

## 2017-12-29 MED ORDER — ONDANSETRON HCL 4 MG/2ML IJ SOLN: 4.0000 mg | Freq: Four times a day (QID) | INTRAMUSCULAR | Status: DC | PRN

## 2017-12-29 MED ORDER — DOCUSATE SODIUM 100 MG PO CAPS
100.0000 mg | ORAL_CAPSULE | Freq: Two times a day (BID) | ORAL | Status: DC
  Administered 2017-12-29: 100 mg via ORAL
  Filled 2017-12-29: qty 1

## 2017-12-29 MED ORDER — MECLIZINE HCL 25 MG PO TABS: 25.0000 mg | ORAL_TABLET | Freq: Three times a day (TID) | ORAL | Status: DC | PRN

## 2017-12-29 NOTE — ED Notes (Signed)
Assisted pt to the restroom 

## 2017-12-29 NOTE — Progress Notes (Signed)
Reviewed chart as was told to see patient, full consult to follow

## 2017-12-29 NOTE — Consult Note (Signed)
Ann Welch is a 84 y.o. female  [id number]  Primary Cardiologist: New patient to Dr. Adrian Blackwater Reason for Consultation: Racing heart rate and mildly elevated troponin  HPI: 84yo female with a past medical history of COPD, OSA, GERD, atrial fibrillation called EMS due to racing heart rate that would not stop. Heart rate was in the 180s per EMS and thought to be SVT, but no strips are available. She was given IV metoprolol and her heart rate returned to normal. She was having some mild chest pain, but it is improving.     Review of Systems: Feeling well, has some mild chest pressure but no pain.    Past Medical History:  Diagnosis Date  . Anginal pain (HCC)   . Anxiety   . Arthritis   . COPD (chronic obstructive pulmonary disease) (HCC)   . Dyspnea   . Dysrhythmia    Hx of A-Fib  . GERD (gastroesophageal reflux disease)   . Headache    1/week  . History of recent hospitalization 05/05/2017   In [place] for low BP and angina with Heart Cath, no significant findings, Surgical clearance in chart [facility]  . HOH (hard of hearing)    right ear  . Hypertension   . Liver mass   . Meniere disease   . Orthopnea   . Restless leg syndrome   . Sleep apnea    CPAP  . Wears dentures    upper     (Not in a hospital admission)   . aspirin EC  81 mg Oral QHS  . diltiazem  30 mg Oral q morning - 10a  . docusate sodium  100 mg Oral BID  . enoxaparin (LOVENOX) injection  40 mg Subcutaneous Q24H  . famotidine  20 mg Oral Daily  . Melatonin  2 tablet Oral QHS  . metoprolol succinate  25 mg Oral QHS    Infusions:   No Known Allergies  Social History   Socioeconomic History  . Marital status: Married    Spouse name: Not on file  . Number of children: Not on file  . Years of education: Not on file  . Highest education level: Not on file  Occupational History  . Not on file  Social Needs  . Financial resource strain: Not on file  . Food  insecurity:    Worry: Not on file    Inability: Not on file  . Transportation needs:    Medical: Not on file    Non-medical: Not on file  Tobacco Use  . Smoking status: Never Smoker  . Smokeless tobacco: Never Used  Substance and Sexual Activity  . Alcohol use: No  . Drug use: No  . Sexual activity: Not on file  Lifestyle  . Physical activity:    Days per week: Not on file    Minutes per session: Not on file  . Stress: Not on file  Relationships  . Social connections:    Talks on phone: Not on file    Gets together: Not on file    Attends religious service: Not on file    Active member of club or organization: Not on file    Attends meetings of clubs or organizations: Not on file    Relationship status: Not on file  . Intimate partner violence:    Fear of current or ex partner: Not on file    Emotionally abused: Not on file    Physically abused:  Not on file    Forced sexual activity: Not on file  Other Topics Concern  . Not on file  Social History Narrative  . Not on file    History reviewed. No pertinent family history.  PHYSICAL EXAM: Vitals:   12/29/17 0643 12/29/17 0730  BP: 137/83 133/85  Pulse: 73 78  Resp: 17 (!) 21  Temp:    SpO2: 98% 97%    No intake or output data in the 24 hours ending 12/29/17 0834  General:  Tired appearing. No respiratory difficulty HEENT: normal Neck: supple. no JVD. Carotids 2+ bilat; no bruits. No lymphadenopathy or thryomegaly appreciated. Cor: PMI nondisplaced. Regular rate & rhythm. No rubs, gallops or murmurs. Lungs: clear Abdomen: soft, nontender, nondistended. No hepatosplenomegaly. No bruits or masses. Good bowel sounds. Extremities: no cyanosis, clubbing, rash, edema Neuro: alert & oriented x 3, cranial nerves grossly intact. moves all 4 extremities w/o difficulty. Affect pleasant.  ECG: Sinus rhythm 78bpm, Left anterior fascicular block,Consider right ventricular hypertrophy  Results for orders placed or  performed during the hospital encounter of 12/28/17 (from the past 24 hour(s))  Basic metabolic panel     Status: Abnormal   Collection Time: 12/28/17  9:54 PM  Result Value Ref Range   Sodium 134 (L) 135 - 145 mmol/L   Potassium 4.9 3.5 - 5.1 mmol/L   Chloride 102 98 - 111 mmol/L   CO2 26 22 - 32 mmol/L   Glucose, Bld 116 (H) 70 - 99 mg/dL   BUN 13 8 - 23 mg/dL   Creatinine, Ser 1.610.47 0.44 - 1.00 mg/dL   Calcium 9.3 8.9 - 09.610.3 mg/dL   GFR calc non Af Amer >60 >60 mL/min   GFR calc Af Amer >60 >60 mL/min   Anion gap 6 5 - 15  CBC     Status: None   Collection Time: 12/28/17  9:54 PM  Result Value Ref Range   WBC 6.5 4.0 - 10.5 K/uL   RBC 4.34 3.87 - 5.11 MIL/uL   Hemoglobin 13.3 12.0 - 15.0 g/dL   HCT 04.540.1 40.936.0 - 81.146.0 %   MCV 92.4 80.0 - 100.0 fL   MCH 30.6 26.0 - 34.0 pg   MCHC 33.2 30.0 - 36.0 g/dL   RDW 91.412.4 78.211.5 - 95.615.5 %   Platelets 175 150 - 400 K/uL   nRBC 0.0 0.0 - 0.2 %  Troponin I - ONCE - STAT     Status: Abnormal   Collection Time: 12/28/17  9:54 PM  Result Value Ref Range   Troponin I 0.04 (HH) <0.03 ng/mL  TSH     Status: None   Collection Time: 12/28/17  9:54 PM  Result Value Ref Range   TSH 1.744 0.350 - 4.500 uIU/mL  Troponin I - Once     Status: Abnormal   Collection Time: 12/28/17 11:57 PM  Result Value Ref Range   Troponin I 0.07 (HH) <0.03 ng/mL   Dg Chest 2 View  Result Date: 12/28/2017 CLINICAL DATA:  Chest pain and dizziness.  History of COPD. EXAM: CHEST - 2 VIEW COMPARISON:  Chest radiograph October 22, 2016 FINDINGS: Cardiomediastinal silhouette is normal. No pleural effusions or focal consolidations. Mild bronchitic changes. Bibasilar strandy densities. Trachea projects midline and there is no pneumothorax. Soft tissue planes and included osseous structures are non-suspicious. Mild approximate L1 compression fracture. Osteopenia. IMPRESSION: Mild bronchitic changes without focal consolidation. Bibasilar atelectasis. Electronically Signed   By:  Awilda Metroourtnay  Bloomer M.D.   On: 12/28/2017  22:24   Dg Hip Unilat W Or Wo Pelvis 2-3 Views Right  Result Date: 12/28/2017 CLINICAL DATA:  RIGHT hip pain for 5 months, status post fall. EXAM: DG HIP (WITH OR WITHOUT PELVIS) 2-3V RIGHT COMPARISON:  None. FINDINGS: There is no evidence of hip fracture or dislocation. Osteopenia. There is no evidence of arthropathy or other focal bone abnormality. Phleboliths project in the pelvis. IMPRESSION: Negative. Electronically Signed   By: Awilda Metro M.D.   On: 12/28/2017 22:25     ASSESSMENT AND PLAN: Episode of tachycardia requiring EMS to deliver IV metoprolol. Troponin mildly elevated, likely due to demand ischemia. She reports she had a similar episode in  [place] and had a cath and was told it was normal (Unsure of date of episode).  Echo pending.  Troponin has decreased to 0.06, and if no significant structural heart disease on echo,  advise discharge with outpatient Holter monitor, CCTA, and medication management.   Caroleen Hamman, NP-C Cell: [phone]

## 2017-12-29 NOTE — Progress Notes (Signed)
Attempted to place pt on Cpap with nasal mask. Pt did not tolerate Cpap pressure well, decreased Cpap pressure, pt did not tolerate. Pt states she uses nasal pillows at home. Encouraged pt to have family bring in her machine for comfort. Pt agreed.  Pt's RN and MD aware.

## 2017-12-29 NOTE — ED Notes (Signed)
Pt given snack/drink. Up to restroom.

## 2017-12-29 NOTE — H&P (Signed)
Ann Welch is an 84 y.o. female.   Chief Complaint: Chest pain HPI: The patient with past medical history of hypertension, tachyrrhythmias, Mnire's disease and sleep apnea presents to the emergency department complaining of chest pain.  She states that her pain began when she laid down for bed.  She measured her heart rate which was approximately 180.  She thought it might go away but it did not so she called EMS who reports that her rhythm appeared to be SVT.  However we do not have any telemetry strips to document this diagnosis.  Upon arrival her heart rate had improved but she continued to have some chest pressure and tightness.  She denies shortness of breath or diaphoresis.  Initial troponin was barely elevated but repeat showed slight increase from previous which prompted the emergency department staff to call the hospitalist service for admission  Past Medical History:  Diagnosis Date  . Anginal pain (HCC)   . Anxiety   . Arthritis   . COPD (chronic obstructive pulmonary disease) (HCC)   . Dyspnea   . Dysrhythmia    Hx of A-Fib  . GERD (gastroesophageal reflux disease)   . Headache    1/week  . History of recent hospitalization 05/05/2017   In [place] for low BP and angina with Heart Cath, no significant findings, Surgical clearance in chart [facility]  . HOH (hard of hearing)    right ear  . Hypertension   . Liver mass   . Meniere disease   . Orthopnea   . Restless leg syndrome   . Sleep apnea    CPAP  . Wears dentures    upper    Past Surgical History:  Procedure Laterality Date  . BREAST BIOPSY    . CARDIAC CATHETERIZATION    . CATARACT EXTRACTION W/PHACO Right 07/05/2017   Procedure: CATARACT EXTRACTION PHACO AND INTRAOCULAR LENS PLACEMENT (IOC) RIGHT;  Surgeon: Lockie MolaBrasington, Chadwick, MD;  [facility];  Service: Ophthalmology;  Laterality: Right;  Sleep Apnea-CPAP Spanish speaking-understands English  . CATARACT EXTRACTION  W/PHACO Left 10/06/2017   Procedure: CATARACT EXTRACTION PHACO AND INTRAOCULAR LENS PLACEMENT (IOC)  LEFT PRE DIABETIC;  Surgeon: Lockie MolaBrasington, Chadwick, MD;  [facility];  Service: Ophthalmology;  Laterality: Left;  . COLONOSCOPY      History reviewed. No pertinent family history. Hypertension in some family members  Social History:  reports that she has never smoked. She has never used smokeless tobacco. She reports that she does not drink alcohol or use drugs.  Allergies: No Known Allergies  Prior to Admission medications   Medication Sig Start Date End Date Taking? Authorizing Provider  aspirin EC 81 MG tablet Take 81 mg by mouth at bedtime. pm   Yes [provider]  albuterol (PROVENTIL) (2.5 MG/3ML) 0.083% nebulizer solution Take 3 mLs (2.5 mg total) by nebulization every 4 (four) hours as needed for wheezing or shortness of breath. Patient not taking: Reported on 06/30/2017 12/20/14   Irean HongSung, Jade J, MD  alum & mag hydroxide-simeth (MAALOX/MYLANTA) 200-200-20 MG/5ML suspension Take by mouth every 6 (six) hours as needed for indigestion or heartburn.    [provider]  butalbital-acetaminophen-caffeine (FIORICET, ESGIC) 50-325-40 MG tablet as needed.  06/18/16   [provider]  chlorpheniramine-HYDROcodone (TUSSIONEX PENNKINETIC ER) 10-8 MG/5ML SUER Take 5 mLs by mouth 2 (two) times daily. Patient not taking: Reported on 10/22/2016 12/20/14   Irean HongSung, Jade J, MD  cholecalciferol (VITAMIN D) 1000 units tablet  Take 1,000 Units by mouth daily.    [provider]  diltiazem (CARDIZEM) 60 MG tablet Take 1 tablet (60 mg total) by mouth 2 (two) times daily. Patient taking differently: Take 30 mg by mouth daily. am 07/09/16 09/30/17  Emily Filbert, MD  meclizine (ANTIVERT) 25 MG tablet Take 25 mg by mouth 3 (three) times daily as needed for dizziness.    [provider]  Melatonin 5 MG TABS Take 2 tablets by mouth at bedtime.      [provider]  metoprolol succinate (TOPROL-XL) 25 MG 24 hr tablet Take 25 mg by mouth at bedtime.    [provider]  Multiple Vitamins-Minerals (CENTRUM SILVER 50+WOMEN PO) Take by mouth.    [provider]  nitroGLYCERIN (NITROSTAT) 0.4 MG SL tablet Place 0.4 mg under the tongue every 5 (five) minutes as needed for chest pain.    [provider]  polyethylene glycol (MIRALAX / GLYCOLAX) packet Take 17 g by mouth daily.    [provider]  ranitidine (ZANTAC) 150 MG capsule Take 150 mg by mouth daily. am    [provider]  vitamin E 400 UNIT capsule Take 400 Units by mouth daily.    [provider]     Results for orders placed or performed during the hospital encounter of 12/28/17 (from the past 48 hour(s))  Basic metabolic panel     Status: Abnormal   Collection Time: 12/28/17  9:54 PM  Result Value Ref Range   Sodium 134 (L) 135 - 145 mmol/L   Potassium 4.9 3.5 - 5.1 mmol/L    Comment: HEMOLYSIS AT THIS LEVEL MAY AFFECT RESULT   Chloride 102 98 - 111 mmol/L   CO2 26 22 - 32 mmol/L   Glucose, Bld 116 (H) 70 - 99 mg/dL   BUN 13 8 - 23 mg/dL   Creatinine, Ser 1.61 0.44 - 1.00 mg/dL   Calcium 9.3 8.9 - 09.6 mg/dL   GFR calc non Af Amer >60 >60 mL/min   GFR calc Af Amer >60 >60 mL/min   Anion gap 6 5 - 15    Comment: Performed at [facility], [address] [place], [place] [address]  CBC     Status: None   Collection Time: 12/28/17  9:54 PM  Result Value Ref Range   WBC 6.5 4.0 - 10.5 K/uL   RBC 4.34 3.87 - 5.11 MIL/uL   Hemoglobin 13.3 12.0 - 15.0 g/dL   HCT 98.1 19.1 - 47.8 %   MCV 92.4 80.0 - 100.0 fL   MCH 30.6 26.0 - 34.0 pg   MCHC 33.2 30.0 - 36.0 g/dL   RDW 29.5 62.1 - 30.8 %   Platelets 175 150 - 400 K/uL   nRBC 0.0 0.0 - 0.2 %    Comment: Performed at [facility], [address] [place], [place] [address]  Troponin I - ONCE - STAT     Status: Abnormal   Collection Time:  12/28/17  9:54 PM  Result Value Ref Range   Troponin I 0.04 (HH) <0.03 ng/mL    Comment: CRITICAL RESULT CALLED TO, READ BACK BY AND VERIFIED WITH BUTCH WOODS @2305  12/28/17 [facility] Performed at [facility], [address] [place], [place] [address]   TSH     Status: None   Collection Time: 12/28/17  9:54 PM  Result Value Ref Range   TSH 1.744 0.350 - 4.500 uIU/mL    Comment: Performed by a  3rd Generation assay with a functional sensitivity of <=0.01 uIU/mL. Performed at [facility], [address] [place], [place] [address]   Troponin I - Once     Status: Abnormal   Collection Time: 12/28/17 11:57 PM  Result Value Ref Range   Troponin I 0.07 (HH) <0.03 ng/mL    Comment: CRITICAL VALUE NOTED. VALUE IS CONSISTENT WITH PREVIOUSLY REPORTED/CALLED VALUE AKT Performed at [facility], [address] [place] [place], [place] [address]    Dg Chest 2 View  Result Date: 12/28/2017 CLINICAL DATA:  Chest pain and dizziness.  History of COPD. EXAM: CHEST - 2 VIEW COMPARISON:  Chest radiograph October 22, 2016 FINDINGS: Cardiomediastinal silhouette is normal. No pleural effusions or focal consolidations. Mild bronchitic changes. Bibasilar strandy densities. Trachea projects midline and there is no pneumothorax. Soft tissue planes and included osseous structures are non-suspicious. Mild approximate L1 compression fracture. Osteopenia. IMPRESSION: Mild bronchitic changes without focal consolidation. Bibasilar atelectasis. Electronically Signed   By: Awilda Metro M.D.   On: 12/28/2017 22:24   Dg Hip Unilat W Or Wo Pelvis 2-3 Views Right  Result Date: 12/28/2017 CLINICAL DATA:  RIGHT hip pain for 5 months, status post fall. EXAM: DG HIP (WITH OR WITHOUT PELVIS) 2-3V RIGHT COMPARISON:  None. FINDINGS: There is no evidence of hip fracture or dislocation. Osteopenia. There is no evidence of arthropathy or other focal bone abnormality. Phleboliths project in the pelvis.  IMPRESSION: Negative. Electronically Signed   By: Awilda Metro M.D.   On: 12/28/2017 22:25    Review of Systems  Constitutional: Negative for chills and fever.  HENT: Negative for sore throat and tinnitus.   Eyes: Negative for blurred vision and redness.  Respiratory: Negative for cough and shortness of breath.   Cardiovascular: Positive for chest pain and palpitations. Negative for orthopnea and PND.  Gastrointestinal: Negative for abdominal pain, diarrhea, nausea and vomiting.  Genitourinary: Negative for dysuria, frequency and urgency.  Musculoskeletal: Negative for joint pain and myalgias.  Skin: Negative for rash.       No lesions  Neurological: Positive for dizziness. Negative for speech change, focal weakness and weakness.  Endo/Heme/Allergies: Does not bruise/bleed easily.       No temperature intolerance  Psychiatric/Behavioral: Negative for depression and suicidal ideas.    Blood pressure 137/83, pulse 73, temperature 97.9 F (36.6 C), temperature source Oral, resp. rate 17, height 5\' 4"  (1.626 m), weight 61.2 kg, SpO2 98 %. Physical Exam  Vitals reviewed. Constitutional: She is oriented to person, place, and time. She appears well-developed and well-nourished. No distress.  HENT:  Head: Normocephalic and atraumatic.  Mouth/Throat: Oropharynx is clear and moist.  Eyes: Pupils are equal, round, and reactive to light. Conjunctivae and EOM are normal. No scleral icterus.  Neck: Normal range of motion. Neck supple. No JVD present. No tracheal deviation present. No thyromegaly present.  Cardiovascular: Normal rate, regular rhythm and normal heart sounds. Exam reveals no gallop and no friction rub.  No murmur heard. Respiratory: Effort normal and breath sounds normal.  GI: Soft. Bowel sounds are normal. She exhibits no distension. There is no tenderness.  Genitourinary:  Genitourinary Comments: Deferred  Musculoskeletal: Normal range of motion. She exhibits no edema.   Lymphadenopathy:    She has no cervical adenopathy.  Neurological: She is alert and oriented to person, place, and time. No cranial nerve deficit. She exhibits normal muscle tone.  Skin: Skin is warm and dry. No rash noted. No erythema.  Psychiatric: She has a  normal mood and affect. Her behavior is normal. Judgment and thought content normal.     Assessment/Plan This is an 84 year old female admitted for chest pain. 1.  Chest pain: Continues to have some nagging chest pressure but no pain.  Follow cardiac biomarkers.  Monitor telemetry 2.  Tachycardia: Resolved.  Continue diltiazem.  I have ordered the patient's medication according to her last outpatient visit note.  However she states that [facility] changed her diltiazem to 30 mg 1 time daily.  The patient reports that diltiazem makes her dizzy although it is unclear if the medication or if possible SVT or other arrhythmia is the etiology of her dizziness.  She has not seen a cardiologist since [facility] started coordinating care.  Consider Holter or event monitor. 3.  Hypertension: Controlled for age; continue metoprolol 4.  OSA: Continue CPAP 5.  DVT prophylaxis: Lovenox 6.  GI prophylaxis: H2 blocker The patient is a full code.  Time spent on admission orders and patient care approximately 45 minutes  Arnaldo Natal, MD 12/29/2017, 7:21 AM

## 2017-12-29 NOTE — Discharge Summary (Signed)
[employer] [facility] [place] at [facility]   PATIENT NAME: Ann Welch    MR#:  [id number]  DATE OF BIRTH:  1931/05/04  DATE OF ADMISSION:  12/28/2017   ADMITTING PHYSICIAN: Arnaldo Natal, MD  DATE OF DISCHARGE: 12/29/2017 11:44 AM  PRIMARY CARE PHYSICIAN: [employer]   ADMISSION DIAGNOSIS:   Chest Pain  DISCHARGE DIAGNOSIS:   Active Problems:   Chest pain   SECONDARY DIAGNOSIS:   Past Medical History:  Diagnosis Date  . Anginal pain (HCC)   . Anxiety   . Arthritis   . COPD (chronic obstructive pulmonary disease) (HCC)   . Dyspnea   . Dysrhythmia    Hx of A-Fib  . GERD (gastroesophageal reflux disease)   . Headache    1/week  . History of recent hospitalization 05/05/2017   In [place] for low BP and angina with Heart Cath, no significant findings, Surgical clearance in chart [facility]  . HOH (hard of hearing)    right ear  . Hypertension   . Liver mass   . Meniere disease   . Orthopnea   . Restless leg syndrome   . Sleep apnea    CPAP  . Wears dentures    upper    HOSPITAL COURSE:   84 year old female with past medical history significant for hypertension, sleep apnea, Mnire's disease, history of SVT and tachyarrhythmias presented to hospital secondary to chest pain and noted to have heart rate of 180  1.  Chest pain-secondary to tachyarrhythmia and elevated heart rate -Slightly elevated troponins but like to do, secondary to demand ischemia from elevated heart rate.  2.  SVT-resolved with the Cardizem.  Patient does take Cardizem orally.  She is seen by [facility] cardiology here in the hospital.  And she suggested discharge and she will follow-up with the patient as outpatient as early as tomorrow. -Patient denies any chest pain now, heart rate is in the 70s and she is in normal sinus rhythm. -Patient [place] cardiac [place] in the [place] and was noted to be normal according to  her. -Outpatient echo, Holter monitor as recommended by her cardiologist.  3.  Hypertension-continue Toprol and Cardizem  4.  Mnire's disease with dizziness-meclizine as needed  Patient will be discharged home today  DISCHARGE CONDITIONS:   Guarded  CONSULTS OBTAINED:   Treatment Team:  Laurier Nancy, MD  DRUG ALLERGIES:   No Known Allergies DISCHARGE MEDICATIONS:   Allergies as of 12/29/2017   No Known Allergies     Medication List    STOP taking these medications   albuterol (2.5 MG/3ML) 0.083% nebulizer solution Commonly known as:  PROVENTIL   chlorpheniramine-HYDROcodone 10-8 MG/5ML Suer Commonly known as:  TUSSIONEX     TAKE these medications   alum & mag hydroxide-simeth 200-200-20 MG/5ML suspension Commonly known as:  MAALOX/MYLANTA Take by mouth every 6 (six) hours as needed for indigestion or heartburn.   aspirin EC 81 MG tablet Take 81 mg by mouth at bedtime. pm   butalbital-acetaminophen-caffeine 50-325-40 MG tablet Commonly known as:  FIORICET, ESGIC as needed.   CENTRUM SILVER 50+WOMEN PO Take by mouth.   cholecalciferol 1000 units tablet Commonly known as:  VITAMIN D Take 1,000 Units by mouth daily.   diltiazem 60 MG tablet Commonly known as:  CARDIZEM Take 0.5 tablets (30 mg total) by mouth daily. am   meclizine 25 MG tablet Commonly known as:  ANTIVERT Take  25 mg by mouth 3 (three) times daily as needed for dizziness.   Melatonin 5 MG Tabs Take 2 tablets by mouth at bedtime.   metoprolol succinate 25 MG 24 hr tablet Commonly known as:  TOPROL-XL Take 25 mg by mouth at bedtime.   nitroGLYCERIN 0.4 MG SL tablet Commonly known as:  NITROSTAT Place 0.4 mg under the tongue every 5 (five) minutes as needed for chest pain.   polyethylene glycol packet Commonly known as:  MIRALAX / GLYCOLAX Take 17 g by mouth daily.   ranitidine 150 MG capsule Commonly known as:  ZANTAC Take 150 mg by mouth daily. am   vitamin E 400 UNIT  capsule Take 400 Units by mouth daily.        DISCHARGE INSTRUCTIONS:   1. PCP f/u 1-2 weeks  DIET:   Cardiac diet  ACTIVITY:   Activity as tolerated  OXYGEN:   Home Oxygen: No.  Oxygen Delivery: room air  DISCHARGE LOCATION:   home   If you experience worsening of your admission symptoms, develop shortness of breath, life threatening emergency, suicidal or homicidal thoughts you must seek medical attention immediately by calling 911 or calling your MD immediately  if symptoms less severe.  You Must read complete instructions/literature along with all the possible adverse reactions/side effects for all the Medicines you take and that have been prescribed to you. Take any new Medicines after you have completely understood and accpet all the possible adverse reactions/side effects.   Please note  You were cared for by a hospitalist during your hospital stay. If you have any questions about your discharge medications or the care you received while you were in the hospital after you are discharged, you can call the unit and asked to speak with the hospitalist on call if the hospitalist that took care of you is not available. Once you are discharged, your primary care physician will handle any further medical issues. Please note that NO REFILLS for any discharge medications will be authorized once you are discharged, as it is imperative that you return to your primary care physician (or establish a relationship with a primary care physician if you do not have one) for your aftercare needs so that they can reassess your need for medications and monitor your lab values.    On the day of Discharge:  VITAL SIGNS:   Blood pressure (!) 143/82, pulse 71, temperature 97.6 F (36.4 C), temperature source Axillary, resp. rate 16, height 5\' 4"  (1.626 m), weight 61.2 kg, SpO2 99 %.  PHYSICAL EXAMINATION:    GENERAL:  84 y.o.-year-old patient lying in the bed with no acute distress.   EYES: Pupils equal, round, reactive to light and accommodation. No scleral icterus. Extraocular muscles intact.  HEENT: Head atraumatic, normocephalic. Oropharynx and nasopharynx clear.  NECK:  Supple, no jugular venous distention. No thyroid enlargement, no tenderness.  LUNGS: Normal breath sounds bilaterally, no wheezing, rales,rhonchi or crepitation. No use of accessory muscles of respiration.  CARDIOVASCULAR: S1, S2 normal. No murmurs, rubs, or gallops.  ABDOMEN: Soft, non-tender, non-distended. Bowel sounds present. No organomegaly or mass.  EXTREMITIES: No pedal edema, cyanosis, or clubbing.  NEUROLOGIC: Cranial nerves II through XII are intact. Muscle strength 5/5 in all extremities. Sensation intact. Gait not checked.  PSYCHIATRIC: The patient is alert and oriented x 3.  SKIN: No obvious rash, lesion, or ulcer.   DATA REVIEW:   CBC Recent Labs  Lab 12/28/17 2154  WBC 6.5  HGB 13.3  HCT  40.1  PLT 175    Chemistries  Recent Labs  Lab 12/28/17 2154  NA 134*  K 4.9  CL 102  CO2 26  GLUCOSE 116*  BUN 13  CREATININE 0.47  CALCIUM 9.3     Microbiology Results  No results found for this or any previous visit.  RADIOLOGY:  Dg Chest 2 View  Result Date: 12/28/2017 CLINICAL DATA:  Chest pain and dizziness.  History of COPD. EXAM: CHEST - 2 VIEW COMPARISON:  Chest radiograph October 22, 2016 FINDINGS: Cardiomediastinal silhouette is normal. No pleural effusions or focal consolidations. Mild bronchitic changes. Bibasilar strandy densities. Trachea projects midline and there is no pneumothorax. Soft tissue planes and included osseous structures are non-suspicious. Mild approximate L1 compression fracture. Osteopenia. IMPRESSION: Mild bronchitic changes without focal consolidation. Bibasilar atelectasis. Electronically Signed   By: Awilda Metro M.D.   On: 12/28/2017 22:24   Dg Hip Unilat W Or Wo Pelvis 2-3 Views Right  Result Date: 12/28/2017 CLINICAL DATA:  RIGHT  hip pain for 5 months, status post fall. EXAM: DG HIP (WITH OR WITHOUT PELVIS) 2-3V RIGHT COMPARISON:  None. FINDINGS: There is no evidence of hip fracture or dislocation. Osteopenia. There is no evidence of arthropathy or other focal bone abnormality. Phleboliths project in the pelvis. IMPRESSION: Negative. Electronically Signed   By: Awilda Metro M.D.   On: 12/28/2017 22:25     Management plans discussed with the patient, family and they are in agreement.  CODE STATUS:     Code Status Orders  (From admission, onward)         Start     Ordered   12/29/17 0815  Full code  Continuous     12/29/17 0814        Code Status History    This patient has a current code status but no historical code status.    Advance Directive Documentation     Most Recent Value  Type of Advance Directive  Out of facility DNR (pink MOST or yellow form)  Pre-existing out of facility DNR order (yellow form or pink MOST form)  Yellow form placed in chart (order not valid for inpatient use)  "MOST" Form in Place?  -      TOTAL TIME TAKING CARE OF THIS PATIENT: 38 minutes.    Enid Baas M.D on 12/29/2017 at 2:38 PM  Between 7am to 6pm - Pager - [phone]  After 6pm go to [email or web address] - Scientist, research (life sciences) [facility]  Office  [phone]  CC: Primary care physician; [facility]   Note: This dictation was prepared with Dragon dictation along with smaller Lobbyist. Any transcriptional errors that result from this process are unintentional.

## 2018-07-27 ENCOUNTER — Ambulatory Visit: Payer: Medicare (Managed Care)

## 2018-07-28 ENCOUNTER — Other Ambulatory Visit: Payer: Self-pay

## 2018-07-28 ENCOUNTER — Ambulatory Visit
Admission: RE | Admit: 2018-07-28 | Discharge: 2018-07-28 | Disposition: A | Discharge status: Home / Self Care | Payer: Medicare (Managed Care) | Source: Ambulatory Visit | Attending: Family | Admitting: Family

## 2018-08-02 ENCOUNTER — Ambulatory Visit
Admission: RE | Admit: 2018-08-02 | Discharge: 2018-08-02 | Disposition: A | Discharge status: Home / Self Care | Payer: Medicare (Managed Care) | Source: Ambulatory Visit | Attending: Internal Medicine | Admitting: Internal Medicine

## 2018-08-02 ENCOUNTER — Other Ambulatory Visit: Payer: Self-pay

## 2018-08-02 DIAGNOSIS — I471 Supraventricular tachycardia: Secondary | ICD-10-CM | POA: Insufficient documentation

## 2018-08-17 ENCOUNTER — Other Ambulatory Visit (HOSPITAL_COMMUNITY): Payer: Self-pay | Admitting: Family

## 2018-08-17 ENCOUNTER — Other Ambulatory Visit: Payer: Self-pay | Admitting: Family

## 2018-08-17 DIAGNOSIS — R109 Unspecified abdominal pain: Secondary | ICD-10-CM

## 2018-08-18 ENCOUNTER — Ambulatory Visit: Payer: Medicare (Managed Care)

## 2018-09-17 ENCOUNTER — Emergency Department
Admission: EM | Admit: 2018-09-17 | Discharge: 2018-09-17 | Disposition: A | Discharge status: Home / Self Care | Payer: Medicare (Managed Care) | Attending: Student in an Organized Health Care Education/Training Program | Admitting: Student in an Organized Health Care Education/Training Program

## 2018-09-17 ENCOUNTER — Other Ambulatory Visit: Payer: Self-pay

## 2018-09-17 ENCOUNTER — Emergency Department: Payer: Medicare (Managed Care)

## 2018-09-17 DIAGNOSIS — R002 Palpitations: Secondary | ICD-10-CM | POA: Diagnosis present

## 2018-09-17 DIAGNOSIS — I10 Essential (primary) hypertension: Secondary | ICD-10-CM | POA: Diagnosis not present

## 2018-09-17 DIAGNOSIS — I471 Supraventricular tachycardia, unspecified: Secondary | ICD-10-CM

## 2018-09-17 DIAGNOSIS — Z79899 Other long term (current) drug therapy: Secondary | ICD-10-CM | POA: Insufficient documentation

## 2018-09-17 DIAGNOSIS — J449 Chronic obstructive pulmonary disease, unspecified: Secondary | ICD-10-CM | POA: Diagnosis not present

## 2018-09-17 DIAGNOSIS — Z7982 Long term (current) use of aspirin: Secondary | ICD-10-CM | POA: Diagnosis not present

## 2018-09-17 LAB — CBC WITH DIFFERENTIAL/PLATELET
Abs Immature Granulocytes: 0.03 10*3/uL (ref 0.00–0.07)
Basophils Absolute: 0 10*3/uL (ref 0.0–0.1)
Basophils Relative: 1 %
Eosinophils Absolute: 0.4 10*3/uL (ref 0.0–0.5)
Eosinophils Relative: 8 %
HCT: 40.6 % (ref 36.0–46.0)
Hemoglobin: 13.4 g/dL (ref 12.0–15.0)
Immature Granulocytes: 1 %
Lymphocytes Relative: 29 %
Lymphs Abs: 1.5 10*3/uL (ref 0.7–4.0)
MCH: 30.2 pg (ref 26.0–34.0)
MCHC: 33 g/dL (ref 30.0–36.0)
MCV: 91.6 fL (ref 80.0–100.0)
Monocytes Absolute: 0.4 10*3/uL (ref 0.1–1.0)
Monocytes Relative: 8 %
Neutro Abs: 2.7 10*3/uL (ref 1.7–7.7)
Neutrophils Relative %: 53 %
Platelets: 157 10*3/uL (ref 150–400)
RBC: 4.43 MIL/uL (ref 3.87–5.11)
RDW: 12.6 % (ref 11.5–15.5)
WBC: 5.2 10*3/uL (ref 4.0–10.5)
nRBC: 0 % (ref 0.0–0.2)

## 2018-09-17 LAB — COMPREHENSIVE METABOLIC PANEL
ALT: 33 U/L (ref 0–44)
AST: 25 U/L (ref 15–41)
Albumin: 3.8 g/dL (ref 3.5–5.0)
Alkaline Phosphatase: 63 U/L (ref 38–126)
Anion gap: 8 (ref 5–15)
BUN: 12 mg/dL (ref 8–23)
CO2: 25 mmol/L (ref 22–32)
Calcium: 9.1 mg/dL (ref 8.9–10.3)
Chloride: 104 mmol/L (ref 98–111)
Creatinine, Ser: 0.58 mg/dL (ref 0.44–1.00)
GFR calc Af Amer: >60 mL/min (ref >60)
GFR calc non Af Amer: >60 mL/min (ref >60)
Glucose, Bld: 137 mg/dL — ABNORMAL HIGH (ref 70–99)
Potassium: 3.6 mmol/L (ref 3.5–5.1)
Sodium: 137 mmol/L (ref 135–145)
Total Bilirubin: 0.5 mg/dL (ref 0.3–1.2)
Total Protein: 6.6 g/dL (ref 6.5–8.1)

## 2018-09-17 LAB — TROPONIN I (HIGH SENSITIVITY): Troponin I (High Sensitivity): 6 ng/L (ref <18)

## 2018-09-17 MED ORDER — DILTIAZEM HCL 60 MG PO TABS
30.0000 mg | ORAL_TABLET | Freq: Once | ORAL | Status: AC
  Administered 2018-09-17: 30 mg via ORAL
  Filled 2018-09-17: qty 1

## 2018-09-17 NOTE — ED Notes (Signed)
Pt's son to bedside. Pt given warm blankets.

## 2018-09-17 NOTE — Discharge Instructions (Signed)
Follow up with your [place].  I recommend you start taking 30mg  of diltiazem twice daily to help reduce the risk of SVT (palpitations).

## 2018-09-17 NOTE — ED Notes (Signed)
Peripheral IV discontinued. Catheter intact. No signs of infiltration or redness. Gauze applied to IV site.   Discharge instructions reviewed with patient. Questions fielded by this RN. Patient verbalizes understanding of instructions. Patient discharged home in stable condition per robinson. No acute distress noted at time of discharge.   Pt dressed and wheeled to son's car, follow-up and DC instructions reviewed with both, cautioned against caffeine excess

## 2018-09-17 NOTE — ED Notes (Signed)
Pt assisted to use bedside toilet. Pt steady.

## 2018-09-17 NOTE — ED Notes (Signed)
EKG completed

## 2018-09-17 NOTE — ED Notes (Addendum)
2nd trop declined verbal from [employer]. Pt assisted to use bedside toilet.

## 2018-09-17 NOTE — ED Provider Notes (Signed)
[facility] Emergency Department Provider Note    First MD Initiated Contact with Patient 09/17/18 1628     (approximate)  I have reviewed the triage vital signs and the nursing notes.   HISTORY  Chief Complaint Chest Pain and Shortness of Breath    HPI Ann Welch is a 85 y.o. female with a history of SVT and the below listed past medical history presents for palpitations and chest discomfort that started this morning.  States it lasted a lot longer than her previous episodes of SVT.  EMS was called and found her to be in SVT they gave her single dose of adenosine with conversion to sinus.  Denies any chest discomfort.  States that she did take her morning diltiazem as prescribed.  Denies any nausea or vomiting.    Past Medical History:  Diagnosis Date  . Anginal pain (HCC)   . Anxiety   . Arthritis   . COPD (chronic obstructive pulmonary disease) (HCC)   . Dyspnea   . Dysrhythmia    Hx of A-Fib  . GERD (gastroesophageal reflux disease)   . Headache    1/week  . History of recent hospitalization 05/05/2017   In [place] for low [place] and angina with Heart Cath, no significant findings, Surgical clearance in [facility]  . HOH (hard of hearing)    right ear  . Hypertension   . Liver mass   . Meniere disease   . Orthopnea   . Restless leg syndrome   . Sleep apnea    CPAP  . Wears dentures    upper   History reviewed. No pertinent family history. Past Surgical History:  Procedure Laterality Date  . BREAST BIOPSY    . CARDIAC CATHETERIZATION    . CATARACT EXTRACTION W/PHACO Right 07/05/2017   Procedure: CATARACT EXTRACTION PHACO AND INTRAOCULAR LENS PLACEMENT (IOC) RIGHT;  Surgeon: Lockie MolaBrasington, Chadwick, MD;  [facility];  Service: Ophthalmology;  Laterality: Right;  Sleep Apnea-CPAP Spanish speaking-understands English  . CATARACT EXTRACTION W/PHACO Left 10/06/2017   Procedure: CATARACT EXTRACTION  PHACO AND INTRAOCULAR LENS PLACEMENT (IOC)  LEFT PRE DIABETIC;  Surgeon: Lockie MolaBrasington, Chadwick, MD;  [facility];  Service: Ophthalmology;  Laterality: Left;  . COLONOSCOPY     Patient Active Problem List   Diagnosis Date Noted  . Chest pain 12/29/2017      Prior to Admission medications   Medication Sig Start Date End Date Taking? Authorizing Provider  alum & mag hydroxide-simeth (MAALOX/MYLANTA) 200-200-20 MG/5ML suspension Take by mouth every 6 (six) hours as needed for indigestion or heartburn.    [provider]  aspirin EC 81 MG tablet Take 81 mg by mouth at bedtime. pm    [provider]  butalbital-acetaminophen-caffeine (FIORICET, ESGIC) 50-325-40 MG tablet as needed.  06/18/16   [provider]  cholecalciferol (VITAMIN D) 1000 units tablet Take 1,000 Units by mouth daily.    [provider]  diltiazem (CARDIZEM) 60 MG tablet Take 0.5 tablets (30 mg total) by mouth daily. am 12/29/17 12/29/18  Enid BaasKalisetti, Radhika, MD  meclizine (ANTIVERT) 25 MG tablet Take 25 mg by mouth 3 (three) times daily as needed for dizziness.    [provider]  Melatonin 5 MG TABS Take 2 tablets by mouth at bedtime.     [provider]  metoprolol succinate (TOPROL-XL) 25 MG 24 hr tablet Take 25 mg by mouth at bedtime.    [provider]  Multiple Vitamins-Minerals (CENTRUM SILVER 50+WOMEN PO) Take by mouth.    [provider]  nitroGLYCERIN (NITROSTAT) 0.4 MG SL tablet Place 0.4 mg under the tongue every 5 (five) minutes as needed for chest pain.    [provider]  polyethylene glycol (MIRALAX / GLYCOLAX) packet Take 17 g by mouth daily.    [provider]  ranitidine (ZANTAC) 150 MG capsule Take 150 mg by mouth daily. am    [provider]  vitamin E 400 UNIT capsule Take 400 Units by mouth daily.    [provider]    Allergies Patient has no known allergies.    Social History  Social History   Tobacco Use  . Smoking status: Never Smoker  . Smokeless tobacco: Never Used  Substance Use Topics  . Alcohol use: No  . Drug use: No    Review of Systems Patient denies headaches, rhinorrhea, blurry vision, numbness, shortness of breath, chest pain, edema, cough, abdominal pain, nausea, vomiting, diarrhea, dysuria, fevers, rashes or hallucinations unless otherwise stated above in HPI. ____________________________________________   PHYSICAL EXAM:  VITAL SIGNS: Vitals:   09/17/18 1908 09/17/18 1924  BP:  125/80  Pulse: 88 81  Resp: 20 (!) 21  Temp:    SpO2: 97% 98%    Constitutional: Alert and oriented.  Eyes: Conjunctivae are normal.  Head: Atraumatic. Nose: No congestion/rhinnorhea. Mouth/Throat: Mucous membranes are moist.   Neck: No stridor. Painless ROM.  Cardiovascular: Normal rate, regular rhythm. Grossly normal heart sounds.  Good peripheral circulation. Respiratory: Normal respiratory effort.  No retractions. Lungs CTAB. Gastrointestinal: Soft and nontender. No distention. No abdominal bruits. No CVA tenderness. Genitourinary:  Musculoskeletal: No lower extremity tenderness nor edema.  No joint effusions. Neurologic:  Normal speech and language. No gross focal neurologic deficits are appreciated. No facial droop Skin:  Skin is warm, dry and intact. No rash noted. Psychiatric: Mood and affect are normal. Speech and behavior are normal.  ____________________________________________   LABS (all labs ordered are listed, but only abnormal results are displayed)  Results for orders placed or performed during the hospital encounter of 09/17/18 (from the past 24 hour(s))  CBC with Differential/Platelet     Status: None   Collection Time: 09/17/18  4:48 PM  Result Value Ref Range   WBC 5.2 4.0 - 10.5 K/uL   RBC 4.43 3.87 - 5.11 MIL/uL   Hemoglobin 13.4 12.0 - 15.0 g/dL   HCT 40.6 36.0 - 46.0 %   MCV 91.6 80.0 - 100.0 fL   MCH 30.2 26.0 - 34.0  pg   MCHC 33.0 30.0 - 36.0 g/dL   RDW 12.6 11.5 - 15.5 %   Platelets 157 150 - 400 K/uL   nRBC 0.0 0.0 - 0.2 %   Neutrophils Relative % 53 %   Neutro Abs 2.7 1.7 - 7.7 K/uL   Lymphocytes Relative 29 %   Lymphs Abs 1.5 0.7 - 4.0 K/uL   Monocytes Relative 8 %   Monocytes Absolute 0.4 0.1 - 1.0 K/uL   Eosinophils Relative 8 %   Eosinophils Absolute 0.4 0.0 - 0.5 K/uL   Basophils Relative 1 %   Basophils Absolute 0.0 0.0 - 0.1 K/uL   Immature Granulocytes 1 %   Abs Immature Granulocytes 0.03 0.00 - 0.07 K/uL  Comprehensive metabolic panel     Status: Abnormal   Collection Time: 09/17/18  4:48 PM  Result Value Ref Range   Sodium 137 135 - 145 mmol/L   Potassium 3.6 3.5 -  5.1 mmol/L   Chloride 104 98 - 111 mmol/L   CO2 25 22 - 32 mmol/L   Glucose, Bld 137 (H) 70 - 99 mg/dL   BUN 12 8 - 23 mg/dL   Creatinine, Ser 1.300.58 0.44 - 1.00 mg/dL   Calcium 9.1 8.9 - 86.510.3 mg/dL   Total Protein 6.6 6.5 - 8.1 g/dL   Albumin 3.8 3.5 - 5.0 g/dL   AST 25 15 - 41 U/L   ALT 33 0 - 44 U/L   Alkaline Phosphatase 63 38 - 126 U/L   Total Bilirubin 0.5 0.3 - 1.2 mg/dL   GFR calc non Af Amer >60 >60 mL/min   GFR calc Af Amer >60 >60 mL/min   Anion gap 8 5 - 15  Troponin I (High Sensitivity)     Status: None   Collection Time: 09/17/18  4:48 PM  Result Value Ref Range   Troponin I (High Sensitivity) 6 <18 ng/L   ____________________________________________  EKG My review and personal interpretation at Time: 16:37   Indication: chest pain  Rate: 95  Rhythm: sinus Axis: left Other:borderline prolonged qt, nonspecific st abn, no stemi ____________________________________________  RADIOLOGY  I personally reviewed all radiographic images ordered to evaluate for the above acute complaints and reviewed radiology reports and findings.  These findings were personally discussed with the patient.  Please see medical record for radiology report.  ____________________________________________   PROCEDURES   Procedure(s) performed:  Procedures    Critical Care performed: no ____________________________________________   INITIAL IMPRESSION / ASSESSMENT AND PLAN / ED COURSE  Pertinent labs & imaging results that were available during my care of the patient were reviewed by me and considered in my medical decision making (see chart for details).   DDX: Dysrhythmia, SVT, dehydration, electrolyte abnormality, heart failure, ACS  Dustee Zoe LanMunoz Welch is a 85 y.o. who presents to the ED with palpitations and dysrhythmia with prehospital 12 lead evidence of svt converted to sinus with adenosine.  Currently pain free.  Well appearing.  Has been compliant with her meds.  Will obtain blood work and monitor in the ER.  Clinical Course as of Sep 16 1928  Sat Sep 17, 2018  [id number] Patient remains hemodynamically stable.  Not having any ectopy or dysrhythmia on the telemetry monitor.  She is pain-free.  I have recommended that she increase her diltiazem to 30 milligrams twice daily.  Encouraged follow-up with cardiology as well as her pace program.  Patient demonstrates understanding and agree with plan.   [PR]    Clinical Course User Index [PR] Willy Eddyobinson, Nadyne Gariepy, MD    The patient was evaluated in Emergency Department today for the symptoms described in the history of present illness. He/she was evaluated in the context of the global COVID-19 pandemic, which necessitated consideration that the patient might be at risk for infection with the SARS-CoV-2 virus that causes COVID-19. Institutional protocols and algorithms that pertain to the evaluation of patients at risk for COVID-19 are in a state of rapid change based on information released by regulatory bodies including the CDC and federal and state organizations. These policies and algorithms were followed during the patient's care in the ED.  As part of my medical decision making, I reviewed the following data within the electronic MEDICAL RECORD NUMBER Nursing  notes reviewed and incorporated, Labs reviewed, notes from prior ED visits and [place] Controlled Substance Database   ____________________________________________   FINAL CLINICAL IMPRESSION(S) / ED DIAGNOSES  Final diagnoses:  SVT (supraventricular tachycardia) (  HCC)      NEW MEDICATIONS STARTED DURING THIS VISIT:  New Prescriptions   No medications on file     Note:  This document was prepared using Dragon voice recognition software and may include unintentional dictation errors.    Willy Eddyobinson, Swayze Pries, MD 09/17/18 [phone]

## 2018-09-17 NOTE — ED Notes (Signed)
Registration back to pt's bedside.

## 2018-09-17 NOTE — ED Triage Notes (Signed)
Pt in via EMS from home d/t CP/SOB. HR with fire department SVT 180s; 18g R ac by EMS; 6 of adenosine given by EMS then HR 108 NSR per EMS; 117/78 BP; 95% RA; pt took 1 81mg  ASA this afternoon per EMS. History of HTN and Afib per EMS. 500cc bolus given by EMS.

## 2018-09-17 NOTE — ED Notes (Signed)
[employer] interpreter system in use during triage. EDP Quentin Cornwall at bedside.

## 2019-10-08 ENCOUNTER — Encounter: Payer: Self-pay | Admitting: Emergency Medicine

## 2019-10-08 ENCOUNTER — Other Ambulatory Visit: Payer: Self-pay

## 2019-10-08 DIAGNOSIS — F419 Anxiety disorder, unspecified: Secondary | ICD-10-CM | POA: Insufficient documentation

## 2019-10-08 DIAGNOSIS — G4730 Sleep apnea, unspecified: Secondary | ICD-10-CM | POA: Insufficient documentation

## 2019-10-08 DIAGNOSIS — Z7982 Long term (current) use of aspirin: Secondary | ICD-10-CM | POA: Diagnosis not present

## 2019-10-08 DIAGNOSIS — K219 Gastro-esophageal reflux disease without esophagitis: Secondary | ICD-10-CM | POA: Insufficient documentation

## 2019-10-08 DIAGNOSIS — N858 Other specified noninflammatory disorders of uterus: Secondary | ICD-10-CM | POA: Insufficient documentation

## 2019-10-08 DIAGNOSIS — I4891 Unspecified atrial fibrillation: Secondary | ICD-10-CM | POA: Insufficient documentation

## 2019-10-08 DIAGNOSIS — J449 Chronic obstructive pulmonary disease, unspecified: Secondary | ICD-10-CM | POA: Diagnosis not present

## 2019-10-08 DIAGNOSIS — I10 Essential (primary) hypertension: Secondary | ICD-10-CM | POA: Insufficient documentation

## 2019-10-08 DIAGNOSIS — K922 Gastrointestinal hemorrhage, unspecified: Secondary | ICD-10-CM | POA: Diagnosis not present

## 2019-10-08 DIAGNOSIS — K640 First degree hemorrhoids: Secondary | ICD-10-CM | POA: Diagnosis present

## 2019-10-08 DIAGNOSIS — Z20822 Contact with and (suspected) exposure to covid-19: Secondary | ICD-10-CM | POA: Diagnosis not present

## 2019-10-08 DIAGNOSIS — Z79899 Other long term (current) drug therapy: Secondary | ICD-10-CM | POA: Insufficient documentation

## 2019-10-08 LAB — COMPREHENSIVE METABOLIC PANEL
ALT: 32 U/L (ref 0–44)
AST: 25 U/L (ref 15–41)
Albumin: 4.7 g/dL (ref 3.5–5.0)
Alkaline Phosphatase: 73 U/L (ref 38–126)
Anion gap: 11 (ref 5–15)
BUN: 8 mg/dL (ref 8–23)
CO2: 27 mmol/L (ref 22–32)
Calcium: 10 mg/dL (ref 8.9–10.3)
Chloride: 99 mmol/L (ref 98–111)
Creatinine, Ser: 0.56 mg/dL (ref 0.44–1.00)
GFR calc Af Amer: >60 mL/min (ref >60)
GFR calc non Af Amer: >60 mL/min (ref >60)
Glucose, Bld: 122 mg/dL — ABNORMAL HIGH (ref 70–99)
Potassium: 3.9 mmol/L (ref 3.5–5.1)
Sodium: 137 mmol/L (ref 135–145)
Total Bilirubin: 0.9 mg/dL (ref 0.3–1.2)
Total Protein: 7.6 g/dL (ref 6.5–8.1)

## 2019-10-08 LAB — CBC
HCT: 41.8 % (ref 36.0–46.0)
Hemoglobin: 14.4 g/dL (ref 12.0–15.0)
MCH: 30.8 pg (ref 26.0–34.0)
MCHC: 34.4 g/dL (ref 30.0–36.0)
MCV: 89.3 fL (ref 80.0–100.0)
Platelets: 184 10*3/uL (ref 150–400)
RBC: 4.68 MIL/uL (ref 3.87–5.11)
RDW: 12.6 % (ref 11.5–15.5)
WBC: 9.6 10*3/uL (ref 4.0–10.5)
nRBC: 0 % (ref 0.0–0.2)

## 2019-10-08 LAB — TYPE AND SCREEN
ABO/RH(D): A NEG
Antibody Screen: NEGATIVE

## 2019-10-08 LAB — LIPASE, BLOOD: Lipase: 27 U/L (ref 11–51)

## 2019-10-08 MED ORDER — ONDANSETRON HCL 4 MG/2ML IJ SOLN: 4.0000 mg | Freq: Once | INTRAMUSCULAR | Status: DC | PRN

## 2019-10-08 NOTE — ED Notes (Signed)
D/w Dr. Scotty Court, no additional orders at this time.

## 2019-10-08 NOTE — ED Triage Notes (Addendum)
Pt arrived via [facility] from home with reports of constipation since Fri, pt has drank some plum juice around 3am, started having dark red blood in stools and then would feel like having BM but would have just dark red blood.  Pt c/o nausea and lower abd pain.   Pt also states that she feels like her heart is beating fast at times, pt has hx of a-fib takes aspirin.  Pt reports hx of colitis several  Years ago.

## 2019-10-09 ENCOUNTER — Emergency Department: Payer: Medicare (Managed Care)

## 2019-10-09 ENCOUNTER — Encounter: Payer: Self-pay | Admitting: Radiology

## 2019-10-09 ENCOUNTER — Observation Stay
Admission: EM | Admit: 2019-10-09 | Discharge: 2019-10-12 | Disposition: A | Discharge status: Home / Self Care | Payer: Medicare (Managed Care) | Attending: Internal Medicine | Admitting: Internal Medicine

## 2019-10-09 DIAGNOSIS — N858 Other specified noninflammatory disorders of uterus: Secondary | ICD-10-CM | POA: Diagnosis not present

## 2019-10-09 DIAGNOSIS — K219 Gastro-esophageal reflux disease without esophagitis: Secondary | ICD-10-CM | POA: Diagnosis present

## 2019-10-09 DIAGNOSIS — I482 Chronic atrial fibrillation, unspecified: Secondary | ICD-10-CM | POA: Diagnosis not present

## 2019-10-09 DIAGNOSIS — J449 Chronic obstructive pulmonary disease, unspecified: Secondary | ICD-10-CM | POA: Diagnosis present

## 2019-10-09 DIAGNOSIS — N859 Noninflammatory disorder of uterus, unspecified: Secondary | ICD-10-CM | POA: Diagnosis present

## 2019-10-09 DIAGNOSIS — I10 Essential (primary) hypertension: Secondary | ICD-10-CM

## 2019-10-09 DIAGNOSIS — K625 Hemorrhage of anus and rectum: Secondary | ICD-10-CM

## 2019-10-09 DIAGNOSIS — K922 Gastrointestinal hemorrhage, unspecified: Secondary | ICD-10-CM | POA: Diagnosis present

## 2019-10-09 LAB — CBC
HCT: 39.1 % (ref 36.0–46.0)
HCT: 38.8 % (ref 36.0–46.0)
HCT: 39.7 % (ref 36.0–46.0)
Hemoglobin: 13.4 g/dL (ref 12.0–15.0)
Hemoglobin: 13.3 g/dL (ref 12.0–15.0)
Hemoglobin: 13.5 g/dL (ref 12.0–15.0)
MCH: 30.9 pg (ref 26.0–34.0)
MCH: 31.1 pg (ref 26.0–34.0)
MCH: 31 pg (ref 26.0–34.0)
MCHC: 34.3 g/dL (ref 30.0–36.0)
MCHC: 34.3 g/dL (ref 30.0–36.0)
MCHC: 34 g/dL (ref 30.0–36.0)
MCV: 90.3 fL (ref 80.0–100.0)
MCV: 90.7 fL (ref 80.0–100.0)
MCV: 91.1 fL (ref 80.0–100.0)
Platelets: 173 10*3/uL (ref 150–400)
Platelets: 173 10*3/uL (ref 150–400)
Platelets: 188 10*3/uL (ref 150–400)
RBC: 4.33 MIL/uL (ref 3.87–5.11)
RBC: 4.28 MIL/uL (ref 3.87–5.11)
RBC: 4.36 MIL/uL (ref 3.87–5.11)
RDW: 12.8 % (ref 11.5–15.5)
RDW: 13 % (ref 11.5–15.5)
RDW: 12.9 % (ref 11.5–15.5)
WBC: 7.6 10*3/uL (ref 4.0–10.5)
WBC: 8.7 10*3/uL (ref 4.0–10.5)
WBC: 9.1 10*3/uL (ref 4.0–10.5)
nRBC: 0 % (ref 0.0–0.2)
nRBC: 0 % (ref 0.0–0.2)
nRBC: 0.2 % (ref 0.0–0.2)

## 2019-10-09 LAB — URINALYSIS, COMPLETE (UACMP) WITH MICROSCOPIC
Bacteria, UA: NONE SEEN
Bilirubin Urine: NEGATIVE
Glucose, UA: NEGATIVE mg/dL
Hgb urine dipstick: NEGATIVE
Ketones, ur: 20 mg/dL — AB
Nitrite: NEGATIVE
Protein, ur: NEGATIVE mg/dL
Specific Gravity, Urine: 1.042 — ABNORMAL HIGH (ref 1.005–1.030)
pH: 6 (ref 5.0–8.0)

## 2019-10-09 LAB — PROTIME-INR
INR: 1 (ref 0.8–1.2)
Prothrombin Time: 13.1 seconds (ref 11.4–15.2)

## 2019-10-09 LAB — APTT: aPTT: 36 seconds (ref 24–36)

## 2019-10-09 LAB — SARS CORONAVIRUS 2 BY RT PCR (HOSPITAL ORDER, PERFORMED IN ~~LOC~~ HOSPITAL LAB): SARS Coronavirus 2: NEGATIVE

## 2019-10-09 LAB — HEMOGLOBIN AND HEMATOCRIT, BLOOD
HCT: 39.1 % (ref 36.0–46.0)
Hemoglobin: 13.6 g/dL (ref 12.0–15.0)

## 2019-10-09 MED ORDER — ADULT MULTIVITAMIN W/MINERALS CH
1.0000 | ORAL_TABLET | Freq: Every day | ORAL | Status: DC
  Administered 2019-10-10 – 2019-10-12 (×3): 1 via ORAL
  Filled 2019-10-09 (×3): qty 1

## 2019-10-09 MED ORDER — PANTOPRAZOLE SODIUM 40 MG IV SOLR
40.0000 mg | Freq: Two times a day (BID) | INTRAVENOUS | Status: DC
  Administered 2019-10-09 – 2019-10-12 (×7): 40 mg via INTRAVENOUS
  Filled 2019-10-09 (×7): qty 40

## 2019-10-09 MED ORDER — SODIUM CHLORIDE 0.9 % IV SOLN
INTRAVENOUS | Status: DC
  Administered 2019-10-10: 1000 mL via INTRAVENOUS

## 2019-10-09 MED ORDER — ALBUTEROL SULFATE (2.5 MG/3ML) 0.083% IN NEBU: 3.0000 mL | INHALATION_SOLUTION | RESPIRATORY_TRACT | Status: DC | PRN

## 2019-10-09 MED ORDER — MECLIZINE HCL 25 MG PO TABS
25.0000 mg | ORAL_TABLET | Freq: Three times a day (TID) | ORAL | Status: DC | PRN
  Filled 2019-10-09: qty 1

## 2019-10-09 MED ORDER — IOHEXOL 300 MG/ML  SOLN
75.0000 mL | Freq: Once | INTRAMUSCULAR | Status: AC | PRN
  Administered 2019-10-09: 75 mL via INTRAVENOUS

## 2019-10-09 MED ORDER — NITROGLYCERIN 0.4 MG SL SUBL: 0.4000 mg | SUBLINGUAL_TABLET | SUBLINGUAL | Status: DC | PRN

## 2019-10-09 MED ORDER — HYDRALAZINE HCL 20 MG/ML IJ SOLN: 5.0000 mg | INTRAMUSCULAR | Status: DC | PRN

## 2019-10-09 MED ORDER — TRAZODONE HCL 50 MG PO TABS
25.0000 mg | ORAL_TABLET | Freq: Every day | ORAL | Status: DC
  Administered 2019-10-09 – 2019-10-11 (×3): 25 mg via ORAL
  Filled 2019-10-09 (×3): qty 1

## 2019-10-09 MED ORDER — POLYETHYLENE GLYCOL 3350 17 G PO PACK: 17.0000 g | PACK | Freq: Every day | ORAL | Status: DC | PRN

## 2019-10-09 MED ORDER — MORPHINE SULFATE (PF) 2 MG/ML IV SOLN
2.0000 mg | Freq: Once | INTRAVENOUS | Status: AC
  Administered 2019-10-09: 2 mg via INTRAVENOUS
  Filled 2019-10-09: qty 1

## 2019-10-09 MED ORDER — ONDANSETRON HCL 4 MG/2ML IJ SOLN
4.0000 mg | Freq: Three times a day (TID) | INTRAMUSCULAR | Status: DC | PRN
  Administered 2019-10-11: 4 mg via INTRAVENOUS
  Filled 2019-10-09 (×2): qty 2

## 2019-10-09 MED ORDER — SALINE SPRAY 0.65 % NA SOLN
1.0000 | NASAL | Status: DC | PRN
  Administered 2019-10-10: 1 via NASAL
  Filled 2019-10-09: qty 44

## 2019-10-09 MED ORDER — ACETAMINOPHEN 325 MG PO TABS
650.0000 mg | ORAL_TABLET | Freq: Four times a day (QID) | ORAL | Status: DC | PRN
  Administered 2019-10-10 – 2019-10-11 (×3): 650 mg via ORAL
  Filled 2019-10-09 (×4): qty 2

## 2019-10-09 MED ORDER — METOPROLOL SUCCINATE ER 25 MG PO TB24
25.0000 mg | ORAL_TABLET | Freq: Every day | ORAL | Status: DC
  Administered 2019-10-09 – 2019-10-11 (×3): 25 mg via ORAL
  Filled 2019-10-09 (×3): qty 1

## 2019-10-09 MED ORDER — DM-GUAIFENESIN ER 30-600 MG PO TB12: 1.0000 | ORAL_TABLET | Freq: Two times a day (BID) | ORAL | Status: DC | PRN

## 2019-10-09 MED ORDER — ONDANSETRON HCL 4 MG/2ML IJ SOLN
4.0000 mg | Freq: Once | INTRAMUSCULAR | Status: AC
  Administered 2019-10-09: 4 mg via INTRAVENOUS
  Filled 2019-10-09: qty 2

## 2019-10-09 MED ORDER — MELATONIN 5 MG PO TABS
10.0000 mg | ORAL_TABLET | Freq: Every day | ORAL | Status: DC
  Administered 2019-10-09: 10 mg via ORAL
  Filled 2019-10-09 (×3): qty 2

## 2019-10-09 MED ORDER — VITAMIN D 25 MCG (1000 UNIT) PO TABS
1000.0000 [IU] | ORAL_TABLET | Freq: Every day | ORAL | Status: DC
  Administered 2019-10-10 – 2019-10-12 (×3): 1000 [IU] via ORAL
  Filled 2019-10-09 (×3): qty 1

## 2019-10-09 MED ORDER — PEG 3350-KCL-NA BICARB-NACL 420 G PO SOLR
4000.0000 mL | Freq: Once | ORAL | Status: AC
  Administered 2019-10-09: 4000 mL via ORAL
  Filled 2019-10-09: qty 4000

## 2019-10-09 MED ORDER — VITAMIN E 180 MG (400 UNIT) PO CAPS
400.0000 [IU] | ORAL_CAPSULE | Freq: Every day | ORAL | Status: DC
  Administered 2019-10-10 – 2019-10-12 (×3): 400 [IU] via ORAL
  Filled 2019-10-09 (×5): qty 1

## 2019-10-09 MED ORDER — SODIUM CHLORIDE 0.9 % IV SOLN: INTRAVENOUS | Status: DC

## 2019-10-09 NOTE — ED Notes (Signed)
ED Provider at bedside. 

## 2019-10-09 NOTE — Consult Note (Signed)
[name] , MD [address], [place], [place], [address] [address], [place], [place], [address] Phone: [phone]  Fax: [phone]  Consultation  Referring Provider:    Dr Clyde Lundborg Primary Care Physician:  [facility] Primary Gastroenterologist:  None       Reason for Consultation:     Melena  Date of Admission:  10/09/2019 Date of Consultation:  10/09/2019         HPI:   Ann Welch is a 86 y.o. female presented to the emergency department with lower abdominal pain and bright red blood per rectum.  Associated with dizziness  10/09/2019: CT scan of the abdomen and pelvis with contrast numerous hepatic cysts no other gross abnormality.  10/08/2019: Hemoglobin 14.4 g, this morning 13.6 g INR 1.1   She says that yesterday she had about 3 episodes of blood in the toilet pan, dark red, was associated with passage of very hard stool and preceeding abdominal discomfort. No bowel movements today . No prior colonoscopy . Denies any NSAID use. Has lost 5 lbs weight due to stress about personal issues. No other complaints today   Past Medical History:  Diagnosis Date  . Anginal pain (HCC)   . Anxiety   . Arthritis   . COPD (chronic obstructive pulmonary disease) (HCC)   . Dyspnea   . Dysrhythmia    Hx of A-Fib  . GERD (gastroesophageal reflux disease)   . Headache    1/week  . History of recent hospitalization 05/05/2017   In [place] for low BP and angina with Heart Cath, no significant findings, Surgical clearance in chart [facility]  . HOH (hard of hearing)    right ear  . Hypertension   . Liver mass   . Meniere disease   . Orthopnea   . Restless leg syndrome   . Sleep apnea    CPAP  . Wears dentures    upper    Past Surgical History:  Procedure Laterality Date  . BREAST BIOPSY    . CARDIAC CATHETERIZATION    . CATARACT EXTRACTION W/PHACO Right 07/05/2017   Procedure: CATARACT EXTRACTION  PHACO AND INTRAOCULAR LENS PLACEMENT (IOC) RIGHT;  Surgeon: Lockie Mola, MD;  Location: [facility];  Service: Ophthalmology;  Laterality: Right;  Sleep Apnea-CPAP Spanish speaking-understands English  . CATARACT EXTRACTION W/PHACO Left 10/06/2017   Procedure: CATARACT EXTRACTION PHACO AND INTRAOCULAR LENS PLACEMENT (IOC)  LEFT PRE DIABETIC;  Surgeon: Lockie Mola, MD;  Location: [facility];  Service: Ophthalmology;  Laterality: Left;  . COLONOSCOPY      Prior to Admission medications   Medication Sig Start Date End Date Taking? Authorizing Provider  aspirin EC 81 MG tablet Take 81 mg by mouth at bedtime. pm   Yes [provider]  cholecalciferol (VITAMIN D) 1000 units tablet Take 1,000 Units by mouth daily.   Yes [provider]  diltiazem (CARDIZEM) 60 MG tablet Take 30 mg by mouth every 4 (four) hours as needed. Call MD if you take more than 2 doses in 24 hours 06/14/19  Yes [provider]  Melatonin 5 MG TABS Take 2 tablets by mouth at bedtime.    Yes [provider]  metoprolol succinate (TOPROL-XL) 25 MG 24 hr tablet Take 25 mg by mouth at bedtime.   Yes [provider]  Multiple Vitamins-Minerals (CENTRUM SILVER 50+WOMEN PO) Take by mouth.   Yes [provider]  vitamin E 400 UNIT capsule Take 400 Units by mouth daily.   Yes [provider]  alum & mag hydroxide-simeth (MAALOX/MYLANTA) 200-200-20 MG/5ML suspension Take by mouth every 6 (six) hours as needed for indigestion or heartburn.    [provider]  Artificial Saliva (BIOTENE DRY MOUTH MOISTURIZING) SOLN Take 2 sprays by mouth 6 (six) times daily. AS NEEDED 09/07/19   [provider]  ketotifen (ZADITOR) 0.025 % ophthalmic solution Place 1 drop into both eyes 2 (two) times daily as needed for dry eyes. 09/27/19   [provider]  meclizine (ANTIVERT) 25 MG tablet Take 25 mg by mouth 3 (three) times daily as needed  for dizziness.    [provider]  nitroGLYCERIN (NITROSTAT) 0.4 MG SL tablet Place 0.4 mg under the tongue every 5 (five) minutes as needed for chest pain.    [provider]  Omeprazole 20 MG TBEC Take 20 mg by mouth every morning. 06/09/19   [provider]  polyethylene glycol (MIRALAX / GLYCOLAX) packet Take 17 g by mouth daily.    [provider]  traZODone (DESYREL) 50 MG tablet Take 25 mg by mouth at bedtime. 09/27/19   [provider]    Family History  Problem Relation Age of Onset  . Dementia Sister   . Cancer Sister        Not sure which type of cancer     Social History   Tobacco Use  . Smoking status: Never Smoker  . Smokeless tobacco: Never Used  Vaping Use  . Vaping Use: Never used  Substance Use Topics  . Alcohol use: No  . Drug use: No    Allergies as of 10/08/2019 - Review Complete 10/08/2019  Allergen Reaction Noted  . Chocolate  10/08/2019    Review of Systems:    All systems reviewed and negative except where noted in HPI.   Physical Exam:  Vital signs in last 24 hours: Temp:  [98.1 F (36.7 C)-98.5 F (36.9 C)] 98.1 F (36.7 C) (09/13 0232) Pulse Rate:  [63-75] 63 (09/13 0830) Resp:  [17-18] 17 (09/13 0232) BP: (123-145)/(65-109) 126/69 (09/13 0830) SpO2:  [91 %-98 %] 96 % (09/13 0830) Weight:  [60.8 kg] 60.8 kg (09/12 2104)   General:   Pleasant, cooperative in NAD Head:  Normocephalic and atraumatic. Eyes:   No icterus.   Conjunctiva pink. PERRLA. Ears:  Normal auditory acuity. Neck:  Supple; no masses or thyroidomegaly Lungs: Respirations even and unlabored. Lungs clear to auscultation bilaterally.   No wheezes, crackles, or rhonchi.  Heart:  Regular rate and rhythm;  Without murmur, clicks, rubs or gallops Abdomen:  Soft, nondistended, nontender. Normal bowel sounds. No appreciable masses or hepatomegaly.  No rebound or guarding.  Neurologic:  Alert and oriented x3;  grossly normal  neurologically. Skin:  Intact without significant lesions or rashes. Cervical Nodes:  No significant cervical adenopathy. Psych:  Alert and cooperative. Normal affect.  LAB RESULTS: Recent Labs    10/08/19 2117 10/09/19 0628 10/09/19 0839  WBC 9.6  --  7.6  HGB 14.4 13.6 13.4  HCT 41.8 39.1 39.1  PLT 184  --  173   BMET Recent Labs    10/08/19 2117  NA 137  K 3.9  CL 99  CO2 27  GLUCOSE 122*  BUN 8  CREATININE 0.56  CALCIUM 10.0   LFT Recent Labs    10/08/19 2117  PROT 7.6  ALBUMIN 4.7  AST 25  ALT 32  ALKPHOS 73  BILITOT 0.9   PT/INR Recent Labs    10/09/19 0839  LABPROT 13.1  INR 1.0    STUDIES: CT ABDOMEN PELVIS W CONTRAST  Result Date: 10/09/2019 CLINICAL DATA:  Constipation. Nausea and lower abdominal pain. EXAM: CT ABDOMEN AND PELVIS WITH CONTRAST TECHNIQUE: Multidetector CT imaging of the abdomen and pelvis was performed using the standard protocol following bolus administration of intravenous contrast. CONTRAST:  28mL OMNIPAQUE IOHEXOL 300 MG/ML  SOLN COMPARISON:  None. FINDINGS: Lower chest: The lung bases are clear except for dependent subpleural atelectasis on the right side. No infiltrates or effusions. The heart is normal in size. Minimal scattered aortic calcifications. Small hiatal hernia. Hepatobiliary: Numerous hepatic cysts some of which demonstrate septations and rim like calcifications. No worrisome hepatic lesions or intrahepatic biliary dilatation. The gallbladder is unremarkable. No common bile duct dilatation. The portal and hepatic veins are patent. Pancreas: No mass, inflammation or ductal dilatation. Spleen: Normal size. No focal lesions. Adrenals/Urinary Tract: The adrenal glands and kidneys are unremarkable. No worrisome renal lesions, renal calculi or hydroureteronephrosis. The bladder is unremarkable. Stomach/Bowel: Stomach, duodenum, small bowel and colon are grossly normal without oral contrast. No acute inflammatory changes, mass  lesions or obstructive findings. The terminal ileum is normal. The appendix is normal. Vascular/Lymphatic: Scattered aortic calcifications but no aneurysm or dissection. The branch vessels are patent. The major venous structures are patent. No mesenteric or retroperitoneal mass or adenopathy. Reproductive: Small focus of low attenuation in the region of the mid endometrium measuring a maximum of 9.5 mm. This could be focal thickening of the endometrium or possibly a small endometrial lesion or submucosal fibroid. Pelvic ultrasound examination is suggested for further evaluation. Both ovaries are normal. Other: No pelvic mass or adenopathy. No free pelvic fluid collections. No inguinal mass or adenopathy. No abdominal wall hernia or subcutaneous lesions. Musculoskeletal: Remote compression fractures of L1 and L2. Mild retropulsion at L2. Degenerative lumbar spondylosis with disc disease and facet disease. Left-sided pars defect noted at L4. No acute bony findings or destructive bony changes. IMPRESSION: 1. No acute abdominal/pelvic findings, mass lesions or adenopathy. 2. Numerous hepatic cysts. 3. 9.5 mm focus of low attenuation in the region of the mid endometrium could be focal thickening of the endometrium or possibly a small endometrial lesion or submucosal fibroid. Pelvic ultrasound examination is suggested for further evaluation. Electronically Signed   By: Rudie Meyer M.D.   On: 10/09/2019 06:19      Impression / Plan:   Ann Welch is a 86 y.o. y/o female presented to the emergency room with lower abdominal pain and rectal bleeding.  Hemoglobin over 13 g.  Associated with dizziness.  CT scan of the abdomen showed no abnormalities of the colon.History suggestive of constipation and lower GI bleed  Plan 1.  Monitor CBC and transfuse if needed. 2. Colonoscopy tomorrow   I have discussed alternative options, risks & benefits,  which include, but are not limited to, bleeding, infection,  perforation,respiratory complication & drug reaction.  The patient agrees with this plan & written consent will be obtained.    Thank you for involving me in the care of this patient.      LOS: 0 days   [name], MD  10/09/2019, 9:22 AM

## 2019-10-09 NOTE — H&P (Signed)
History and Physical    Ann Welch UXL:[id number] DOB: [birth year] DOA: 10/09/2019  Referring MD/NP/PA:   PCP: [facility]   Patient coming from:  The patient is coming from home.  At baseline, pt is independent for most of ADL.        Chief Complaint: dark stool and lower abdominal pain  HPI: Ann Welch is a 86 y.o. female with medical history significant of hypertension, COPD, GERD, anxiety, C, RLS, Mnire's disease, hard of hearing, atrial fibrillation on anticoagulation (on baby aspirin), who presents with dark stool, constipation, lower abdominal pain.  Patient symptoms started yesterday afternoon, including dark stool, constipation, lower abdominal pain.  The lower abdominal pain is mild to moderate, aching, nonradiating. Patient has nausea and vomited once without blood in the vomitus.  Denies chest pain, cough, shortness breath.  No fever or chills.  No symptoms of UTI or unilateral weakness.  ED Course: pt was found to have hemoglobin 14.4 initially--> 13.6, lipase 27, pending Covid PCR, electrolytes renal function okay, temperature normal, blood pressure 132/71, heart rate 70, RR 17, oxygen saturation 92 to 98% on room air.  Dr. Tobi Bastos of GI is consulted.  CT abdomen/pelvis: 1. No acute abdominal/pelvic findings, mass lesions or adenopathy. 2. Numerous hepatic cysts. 3. 9.5 mm focus of low attenuation in the region of the mid endometrium could be focal thickening of the endometrium or possibly a small endometrial lesion or submucosal fibroid.    Review of Systems:   General: no fevers, chills, no body weight gain, has fatigue HEENT: no blurry vision, hearing changes or sore throat Respiratory: no dyspnea, coughing, wheezing CV: no chest pain, no palpitations GI: Has nausea, vomiting, abdominal pain, no diarrhea, has melena and constipation GU: no dysuria, burning on urination, increased urinary frequency, hematuria  Ext: no leg  edema Neuro: no unilateral weakness, numbness, or tingling, no vision change or hearing loss Skin: no rash, no skin tear. MSK: No muscle spasm, no deformity, no limitation of range of movement in spin Heme: No easy bruising.  Travel history: No recent long distant travel.  Allergy:  Allergies  Allergen Reactions  . Chocolate     HA    Past Medical History:  Diagnosis Date  . Anginal pain (HCC)   . Anxiety   . Arthritis   . COPD (chronic obstructive pulmonary disease) (HCC)   . Dyspnea   . Dysrhythmia    Hx of A-Fib  . GERD (gastroesophageal reflux disease)   . Headache    1/week  . History of recent hospitalization 05/05/2017   In [place] for low BP and angina with Heart Cath, no significant findings, Surgical clearance in chart [facility]  . HOH (hard of hearing)    right ear  . Hypertension   . Liver mass   . Meniere disease   . Orthopnea   . Restless leg syndrome   . Sleep apnea    CPAP  . Wears dentures    upper    Past Surgical History:  Procedure Laterality Date  . BREAST BIOPSY    . CARDIAC CATHETERIZATION    . CATARACT EXTRACTION W/PHACO Right 07/05/2017   Procedure: CATARACT EXTRACTION PHACO AND INTRAOCULAR LENS PLACEMENT (IOC) RIGHT;  Surgeon: Lockie Mola, MD;  Location: [facility];  Service: Ophthalmology;  Laterality: Right;  Sleep Apnea-CPAP Spanish speaking-understands English  . CATARACT EXTRACTION W/PHACO Left 10/06/2017   Procedure: CATARACT EXTRACTION PHACO AND INTRAOCULAR LENS PLACEMENT (  IOC)  LEFT PRE DIABETIC;  Surgeon: Lockie Mola, MD;  Location: [facility];  Service: Ophthalmology;  Laterality: Left;  . COLONOSCOPY      Social History:  reports that she has never smoked. She has never used smokeless tobacco. She reports that she does not drink alcohol and does not use drugs.  Family History:  Family History  Problem Relation Age of Onset  . Dementia Sister   . Cancer Sister         Not sure which type of cancer     Prior to Admission medications   Medication Sig Start Date End Date Taking? Authorizing Provider  alum & mag hydroxide-simeth (MAALOX/MYLANTA) 200-200-20 MG/5ML suspension Take by mouth every 6 (six) hours as needed for indigestion or heartburn.    [provider]  aspirin EC 81 MG tablet Take 81 mg by mouth at bedtime. pm    [provider]  butalbital-acetaminophen-caffeine (FIORICET, ESGIC) 50-325-40 MG tablet as needed.  06/18/16   [provider]  cholecalciferol (VITAMIN D) 1000 units tablet Take 1,000 Units by mouth daily.    [provider]  diltiazem (CARDIZEM) 60 MG tablet Take 0.5 tablets (30 mg total) by mouth daily. am 12/29/17 12/29/18  Enid Baas, MD  meclizine (ANTIVERT) 25 MG tablet Take 25 mg by mouth 3 (three) times daily as needed for dizziness.    [provider]  Melatonin 5 MG TABS Take 2 tablets by mouth at bedtime.     [provider]  metoprolol succinate (TOPROL-XL) 25 MG 24 hr tablet Take 25 mg by mouth at bedtime.    [provider]  Multiple Vitamins-Minerals (CENTRUM SILVER 50+WOMEN PO) Take by mouth.    [provider]  nitroGLYCERIN (NITROSTAT) 0.4 MG SL tablet Place 0.4 mg under the tongue every 5 (five) minutes as needed for chest pain.    [provider]  polyethylene glycol (MIRALAX / GLYCOLAX) packet Take 17 g by mouth daily.    [provider]  ranitidine (ZANTAC) 150 MG capsule Take 150 mg by mouth daily. am    [provider]  vitamin E 400 UNIT capsule Take 400 Units by mouth daily.    [provider]    Physical Exam: Vitals:   10/09/19 0700 10/09/19 0730 10/09/19 0800 10/09/19 0830  BP: 123/65 131/70 131/76 126/69  Pulse: 66 63 75 63  Resp:      Temp:      TempSrc:      SpO2: 91% 97% 97% 96%  Weight:      Height:       General: Not in acute distress HEENT:       Eyes: PERRL, EOMI, no scleral  icterus.       ENT: No discharge from the ears and nose, no pharynx injection, no tonsillar enlargement.        Neck: No JVD, no bruit, no mass felt. Heme: No neck lymph node enlargement. Cardiac: S1/S2, RRR, No murmurs, No gallops or rubs. Respiratory: No rales, wheezing, rhonchi or rubs. GI: Soft, nondistended, has mild tenderness in lower abdomen, no rebound pain, no organomegaly, BS present. GU: No hematuria Ext: No pitting leg edema bilaterally. 1+DP/PT pulse bilaterally. Musculoskeletal: No joint deformities, No joint redness or warmth, no limitation of ROM in spin. Skin: No rashes.  Neuro: Alert, oriented X3, cranial nerves II-XII grossly intact, moves all extremities normally.  Psych: Patient is not psychotic, no suicidal or hemocidal ideation.  Labs on Admission: I  have personally reviewed following labs and imaging studies  CBC: Recent Labs  Lab 10/08/19 2117 10/09/19 0628 10/09/19 0839  WBC 9.6  --  7.6  HGB 14.4 13.6 13.4  HCT 41.8 39.1 39.1  MCV 89.3  --  90.3  PLT 184  --  173   Basic Metabolic Panel: Recent Labs  Lab 10/08/19 2117  NA 137  K 3.9  CL 99  CO2 27  GLUCOSE 122*  BUN 8  CREATININE 0.56  CALCIUM 10.0   GFR: Estimated Creatinine Clearance: 41.8 mL/min (by C-G formula based on SCr of 0.56 mg/dL). Liver Function Tests: Recent Labs  Lab 10/08/19 2117  AST 25  ALT 32  ALKPHOS 73  BILITOT 0.9  PROT 7.6  ALBUMIN 4.7   Recent Labs  Lab 10/08/19 2117  LIPASE 27   No results for input(s): AMMONIA in the last 168 hours. Coagulation Profile: Recent Labs  Lab 10/09/19 0839  INR 1.0   Cardiac Enzymes: No results for input(s): CKTOTAL, CKMB, CKMBINDEX, TROPONINI in the last 168 hours. BNP (last 3 results) No results for input(s): PROBNP in the last 8760 hours. HbA1C: No results for input(s): HGBA1C in the last 72 hours. CBG: No results for input(s): GLUCAP in the last 168 hours. Lipid Profile: No results for input(s): CHOL, HDL,  LDLCALC, TRIG, CHOLHDL, LDLDIRECT in the last 72 hours. Thyroid Function Tests: No results for input(s): TSH, T4TOTAL, FREET4, T3FREE, THYROIDAB in the last 72 hours. Anemia Panel: No results for input(s): VITAMINB12, FOLATE, FERRITIN, TIBC, IRON, RETICCTPCT in the last 72 hours. Urine analysis:    Component Value Date/Time   COLORURINE STRAW (A) 10/22/2016 2157   APPEARANCEUR CLEAR (A) 10/22/2016 2157   LABSPEC 1.005 10/22/2016 2157   PHURINE 7.0 10/22/2016 2157   GLUCOSEU NEGATIVE 10/22/2016 2157   HGBUR NEGATIVE 10/22/2016 2157   BILIRUBINUR NEGATIVE 10/22/2016 2157   KETONESUR NEGATIVE 10/22/2016 2157   PROTEINUR NEGATIVE 10/22/2016 2157   NITRITE NEGATIVE 10/22/2016 2157   LEUKOCYTESUR TRACE (A) 10/22/2016 2157   Sepsis Labs: @LABRCNTIP (procalcitonin:4,lacticidven:4) ) Recent Results (from the past 240 hour(s))  SARS Coronavirus 2 by RT PCR (hospital order, [facility]) Nasopharyngeal Nasopharyngeal Swab     Status: None   Collection Time: 10/09/19  7:31 AM   Specimen: Nasopharyngeal Swab  Result Value Ref Range Status   SARS Coronavirus 2 NEGATIVE NEGATIVE Final    Comment: (NOTE) SARS-CoV-2 target nucleic acids are NOT DETECTED.  The SARS-CoV-2 RNA is generally detectable in upper and lower respiratory specimens during the acute phase of infection. The lowest concentration of SARS-CoV-2 viral copies this assay can detect is 250 copies / mL. A negative result does not preclude SARS-CoV-2 infection and should not be used as the sole basis for treatment or other patient management decisions.  A negative result may occur with improper specimen collection / handling, submission of specimen other than nasopharyngeal swab, presence of viral mutation(s) within the areas targeted by this assay, and inadequate number of viral copies (<250 copies / mL). A negative result must be combined with clinical observations, patient history, and epidemiological  information.  Fact Sheet for Patients:   BoilerBrush.com.cyhttps://www.fda.gov/media/136312/download  Fact Sheet for Healthcare Providers: https://pope.com/https://www.fda.gov/media/136313/download  This test is not yet approved or  cleared by the [place] FDA and has been authorized for detection and/or diagnosis of SARS-CoV-2 by FDA under an Emergency Use Authorization (EUA).  This EUA will remain in effect (meaning this test can be used) for the duration  of the COVID-19 declaration under Section 564(b)(1) of the Act, 21 U.S.C. section 360bbb-3(b)(1), unless the authorization is terminated or revoked sooner.  Performed at [facility], [address] [place], [place] [address]      Radiological Exams on Admission: CT ABDOMEN PELVIS W CONTRAST  Result Date: 10/09/2019 CLINICAL DATA:  Constipation. Nausea and lower abdominal pain. EXAM: CT ABDOMEN AND PELVIS WITH CONTRAST TECHNIQUE: Multidetector CT imaging of the abdomen and pelvis was performed using the standard protocol following bolus administration of intravenous contrast. CONTRAST:  75mL OMNIPAQUE IOHEXOL 300 MG/ML  SOLN COMPARISON:  None. FINDINGS: Lower chest: The lung bases are clear except for dependent subpleural atelectasis on the right side. No infiltrates or effusions. The heart is normal in size. Minimal scattered aortic calcifications. Small hiatal hernia. Hepatobiliary: Numerous hepatic cysts some of which demonstrate septations and rim like calcifications. No worrisome hepatic lesions or intrahepatic biliary dilatation. The gallbladder is unremarkable. No common bile duct dilatation. The portal and hepatic veins are patent. Pancreas: No mass, inflammation or ductal dilatation. Spleen: Normal size. No focal lesions. Adrenals/Urinary Tract: The adrenal glands and kidneys are unremarkable. No worrisome renal lesions, renal calculi or hydroureteronephrosis. The bladder is unremarkable. Stomach/Bowel: Stomach, duodenum, small bowel and colon are  grossly normal without oral contrast. No acute inflammatory changes, mass lesions or obstructive findings. The terminal ileum is normal. The appendix is normal. Vascular/Lymphatic: Scattered aortic calcifications but no aneurysm or dissection. The branch vessels are patent. The major venous structures are patent. No mesenteric or retroperitoneal mass or adenopathy. Reproductive: Small focus of low attenuation in the region of the mid endometrium measuring a maximum of 9.5 mm. This could be focal thickening of the endometrium or possibly a small endometrial lesion or submucosal fibroid. Pelvic ultrasound examination is suggested for further evaluation. Both ovaries are normal. Other: No pelvic mass or adenopathy. No free pelvic fluid collections. No inguinal mass or adenopathy. No abdominal wall hernia or subcutaneous lesions. Musculoskeletal: Remote compression fractures of L1 and L2. Mild retropulsion at L2. Degenerative lumbar spondylosis with disc disease and facet disease. Left-sided pars defect noted at L4. No acute bony findings or destructive bony changes. IMPRESSION: 1. No acute abdominal/pelvic findings, mass lesions or adenopathy. 2. Numerous hepatic cysts. 3. 9.5 mm focus of low attenuation in the region of the mid endometrium could be focal thickening of the endometrium or possibly a small endometrial lesion or submucosal fibroid. Pelvic ultrasound examination is suggested for further evaluation. Electronically Signed   By: Rudie Meyer M.D.   On: 10/09/2019 06:19     EKG: Independently reviewed.  Sinus rhythm, QTC 439, LAE, LAD, nonspecific T wave change  Assessment/Plan Principal Problem:   GIB (gastrointestinal bleeding) Active Problems:   COPD (chronic obstructive pulmonary disease) (HCC)   GERD (gastroesophageal reflux disease)   Hypertension   Lesion of endometrium   Atrial fibrillation, chronic (HCC)   GIB (gastrointestinal bleeding): Hgb 14.4 -->13.6 -->13.4. Dr. Tobi Bastos of GI is  consulted.  - will place in med-surg bed obs - hold ASA - GI consulted by Ed, will follow up recommendations - NPO now - IVF: 51mL/hr of NS - Start IV pantoprazole 40 mg bid - Zofran IV for nausea - Avoid NSAIDs and SQ heparin - Maintain IV access (2 large bore IVs if possible). - Monitor closely and follow q6h cbc, transfuse as necessary, if Hgb<7.0 - LaB: INR, PTT and type screen   COPD (chronic obstructive pulmonary disease) (HCC): stable -prn albuterol  GERD (gastroesophageal  reflux disease) -on iv protonix  Hypertension -IV hydralazine as needed -Metoprolol  Chronic A fib: HR 70. Pt is not on anticoagulant at home.  Now has active GI bleeding. -Continue metoprolol  Lesion of endometrium: Incidental findings by CT scan. -Need to follow-up with OB/GYN   DVT ppx: SCD Code Status: DNR per her son who is POA Family Communication: Yes, patient's son by phone Disposition Plan:  Anticipate discharge back to previous environment Consults called:  Dr. Tobi Bastos of GI Admission status: Med-surg bed for obs  Status is: Observation  The patient remains OBS appropriate and will d/c before 2 midnights.  Dispo: The patient is from: Home              Anticipated d/c is to: Home              Anticipated d/c date is: 1 day              Patient currently is not medically stable to d/c.           Date of Service 10/09/2019    Lorretta Harp [facility]   If 7PM-7AM, please contact night-coverage [email or web address] 10/09/2019, 9:28 AM

## 2019-10-09 NOTE — ED Notes (Addendum)
Pt oob to BR with 1 assist. Missed hat for urine collect

## 2019-10-09 NOTE — ED Notes (Signed)
Called to give report, was told receiving RN is tied up but will look over chart and call back shortly for report. Name and number left  Shira Bobst [phone]

## 2019-10-09 NOTE — ED Provider Notes (Signed)
[facility] Emergency Department Provider Note  ____________________________________________   First MD Initiated Contact with Patient 10/09/19 [phone]     (approximate)  I have reviewed the triage vital signs and the nursing notes.   HISTORY  Chief Complaint Abdominal Pain and Melena   HPI Ann Welch is a 86 y.o. female with below list of previous medical conditions including COPD, atrial fibrillation presents to the emergency department secondary to lower abdominal discomfort with current pain score 7 out of 10 with associated bright red blood per rectum.  Patient states that she has been constipated since Friday and after drinking prune juice at 3 AM yesterday she had a bowel movement and noted having dark red blood per rectum.  Patient states that subsequent bowel movement was just bright red blood without any stool noted.  With accompanying abdominal discomfort.     Past Medical History:  Diagnosis Date  . Anginal pain (HCC)   . Anxiety   . Arthritis   . COPD (chronic obstructive pulmonary disease) (HCC)   . Dyspnea   . Dysrhythmia    Hx of A-Fib  . GERD (gastroesophageal reflux disease)   . Headache    1/week  . History of recent hospitalization 05/05/2017   In [place] for low BP and angina with Heart Cath, no significant findings, Surgical clearance in chart [facility]  . HOH (hard of hearing)    right ear  . Hypertension   . Liver mass   . Meniere disease   . Orthopnea   . Restless leg syndrome   . Sleep apnea    CPAP  . Wears dentures    upper    Patient Active Problem List   Diagnosis Date Noted  . Chest pain 12/29/2017    Past Surgical History:  Procedure Laterality Date  . BREAST BIOPSY    . CARDIAC CATHETERIZATION    . CATARACT EXTRACTION W/PHACO Right 07/05/2017   Procedure: CATARACT EXTRACTION PHACO AND INTRAOCULAR LENS PLACEMENT (IOC) RIGHT;  Surgeon: Lockie Mola, MD;  Location: [facility];  Service: Ophthalmology;  Laterality: Right;  Sleep Apnea-CPAP Spanish speaking-understands English  . CATARACT EXTRACTION W/PHACO Left 10/06/2017   Procedure: CATARACT EXTRACTION PHACO AND INTRAOCULAR LENS PLACEMENT (IOC)  LEFT PRE DIABETIC;  Surgeon: Lockie Mola, MD;  Location: [facility];  Service: Ophthalmology;  Laterality: Left;  . COLONOSCOPY      Prior to Admission medications   Medication Sig Start Date End Date Taking? Authorizing Provider  alum & mag hydroxide-simeth (MAALOX/MYLANTA) 200-200-20 MG/5ML suspension Take by mouth every 6 (six) hours as needed for indigestion or heartburn.    [provider]  aspirin EC 81 MG tablet Take 81 mg by mouth at bedtime. pm    [provider]  butalbital-acetaminophen-caffeine (FIORICET, ESGIC) 50-325-40 MG tablet as needed.  06/18/16   [provider]  cholecalciferol (VITAMIN D) 1000 units tablet Take 1,000 Units by mouth daily.    [provider]  diltiazem (CARDIZEM) 60 MG tablet Take 0.5 tablets (30 mg total) by mouth daily. am 12/29/17 12/29/18  Enid Baas, MD  meclizine (ANTIVERT) 25 MG tablet Take 25 mg by mouth 3 (three) times daily as needed for dizziness.    [provider]  Melatonin 5 MG TABS Take 2 tablets by mouth at bedtime.     [provider]  metoprolol succinate (TOPROL-XL) 25 MG 24 hr tablet Take 25 mg by mouth at bedtime.  [provider]  Multiple Vitamins-Minerals (CENTRUM SILVER 50+WOMEN PO) Take by mouth.    [provider]  nitroGLYCERIN (NITROSTAT) 0.4 MG SL tablet Place 0.4 mg under the tongue every 5 (five) minutes as needed for chest pain.    [provider]  polyethylene glycol (MIRALAX / GLYCOLAX) packet Take 17 g by mouth daily.    [provider]  ranitidine (ZANTAC) 150 MG capsule Take 150 mg by mouth daily. am    [provider]  vitamin E 400 UNIT capsule Take 400 Units by  mouth daily.    [provider]    Allergies Chocolate  History reviewed. No pertinent family history.  Social History Social History   Tobacco Use  . Smoking status: Never Smoker  . Smokeless tobacco: Never Used  Vaping Use  . Vaping Use: Never used  Substance Use Topics  . Alcohol use: No  . Drug use: No    Review of Systems Constitutional: No fever/chills Eyes: No visual changes. ENT: No sore throat. Cardiovascular: Denies chest pain. Respiratory: Denies shortness of breath. Gastrointestinal: Positive for abdominal pain constipation and bright red blood per rectum Genitourinary: Negative for dysuria. Musculoskeletal: Negative for neck pain.  Negative for back pain. Integumentary: Negative for rash. Neurological: Negative for headaches, focal weakness or numbness.   ____________________________________________   PHYSICAL EXAM:  VITAL SIGNS: ED Triage Vitals  Enc Vitals Group     BP 10/08/19 2103 (!) 145/66     Pulse Rate 10/08/19 2103 72     Resp 10/08/19 2103 18     Temp 10/08/19 2103 98.5 F (36.9 C)     Temp Source 10/08/19 2103 Oral     SpO2 10/08/19 2057 97 %     Weight 10/08/19 2104 60.8 kg (134 lb)     Height 10/08/19 2104 1.6 m (5\' 3" )     Head Circumference --      Peak Flow --      Pain Score 10/08/19 2104 4     Pain Loc --      Pain Edu? --      Excl. in GC? --     Constitutional: Alert and oriented.  Eyes: Conjunctivae are normal.  Head: Atraumatic. Mouth/Throat: Patient is wearing a mask. Neck: No stridor.  No meningeal signs.   Cardiovascular: Normal rate, regular rhythm. Good peripheral circulation. Grossly normal heart sounds. Respiratory: Normal respiratory effort.  No retractions. Gastrointestinal: Soft and nontender.  Lower abdominal discomfort with palpation.  No stool noted in rectum.  Bright red blood noted Musculoskeletal: No lower extremity tenderness nor edema. No gross deformities of extremities. Neurologic:   Normal speech and language. No gross focal neurologic deficits are appreciated.  Skin:  Skin is warm, dry and intact. Psychiatric: Mood and affect are normal. Speech and behavior are normal.  ____________________________________________   LABS (all labs ordered are listed, but only abnormal results are displayed)  Labs Reviewed  COMPREHENSIVE METABOLIC PANEL - Abnormal; Notable for the following components:      Result Value   Glucose, Bld 122 (*)    All other components within normal limits  SARS CORONAVIRUS 2 BY RT PCR (HOSPITAL ORDER, PERFORMED IN  HOSPITAL LAB)  LIPASE, BLOOD  CBC  HEMOGLOBIN AND HEMATOCRIT, BLOOD  URINALYSIS, COMPLETE (UACMP) WITH MICROSCOPIC  TYPE AND SCREEN   ____________________________________________  EKG ED ECG REPORT I, Andrews N Lainy Wrobleski, the attending physician, personally viewed and interpreted this ECG.   Date: 10/09/2019  EKG Time: 9:08  PM  Rate: 69  Rhythm: Normal sinus rhythm  Axis: Normal  Intervals: Normal  ST&T Change: None   RADIOLOGY I, Bethel N Sladen Plancarte, personally viewed and evaluated these images (plain radiographs) as part of my medical decision making, as well as reviewing the written report by the radiologist.  ED MD interpretation: No acute abdominal pelvic findings noted on CT.  Official radiology report(s): CT ABDOMEN PELVIS W CONTRAST  Result Date: 10/09/2019 CLINICAL DATA:  Constipation. Nausea and lower abdominal pain. EXAM: CT ABDOMEN AND PELVIS WITH CONTRAST TECHNIQUE: Multidetector CT imaging of the abdomen and pelvis was performed using the standard protocol following bolus administration of intravenous contrast. CONTRAST:  62mL OMNIPAQUE IOHEXOL 300 MG/ML  SOLN COMPARISON:  None. FINDINGS: Lower chest: The lung bases are clear except for dependent subpleural atelectasis on the right side. No infiltrates or effusions. The heart is normal in size. Minimal scattered aortic calcifications. Small hiatal hernia.  Hepatobiliary: Numerous hepatic cysts some of which demonstrate septations and rim like calcifications. No worrisome hepatic lesions or intrahepatic biliary dilatation. The gallbladder is unremarkable. No common bile duct dilatation. The portal and hepatic veins are patent. Pancreas: No mass, inflammation or ductal dilatation. Spleen: Normal size. No focal lesions. Adrenals/Urinary Tract: The adrenal glands and kidneys are unremarkable. No worrisome renal lesions, renal calculi or hydroureteronephrosis. The bladder is unremarkable. Stomach/Bowel: Stomach, duodenum, small bowel and colon are grossly normal without oral contrast. No acute inflammatory changes, mass lesions or obstructive findings. The terminal ileum is normal. The appendix is normal. Vascular/Lymphatic: Scattered aortic calcifications but no aneurysm or dissection. The branch vessels are patent. The major venous structures are patent. No mesenteric or retroperitoneal mass or adenopathy. Reproductive: Small focus of low attenuation in the region of the mid endometrium measuring a maximum of 9.5 mm. This could be focal thickening of the endometrium or possibly a small endometrial lesion or submucosal fibroid. Pelvic ultrasound examination is suggested for further evaluation. Both ovaries are normal. Other: No pelvic mass or adenopathy. No free pelvic fluid collections. No inguinal mass or adenopathy. No abdominal wall hernia or subcutaneous lesions. Musculoskeletal: Remote compression fractures of L1 and L2. Mild retropulsion at L2. Degenerative lumbar spondylosis with disc disease and facet disease. Left-sided pars defect noted at L4. No acute bony findings or destructive bony changes. IMPRESSION: 1. No acute abdominal/pelvic findings, mass lesions or adenopathy. 2. Numerous hepatic cysts. 3. 9.5 mm focus of low attenuation in the region of the mid endometrium could be focal thickening of the endometrium or possibly a small endometrial lesion or  submucosal fibroid. Pelvic ultrasound examination is suggested for further evaluation. Electronically Signed   By: Rudie Meyer M.D.   On: 10/09/2019 06:19    ____________________________________________    Procedures   ____________________________________________   INITIAL IMPRESSION / MDM / ASSESSMENT AND PLAN / ED COURSE  As part of my medical decision making, I reviewed the following data within the electronic MEDICAL RECORD NUMBER   86 year old female presented with above-stated history and physical exam secondary to GI bleed with abdominal discomfort.  Patient with noted bright red blood on rectal exam.  Initial hemoglobin was 14 point 4 repeat hemoglobin 13.6 CT scan revealed no acute intra-abdominal or pelvic pathology.  On reevaluation patient states that she is feeling dizzy concerned that the patient will have further blood loss and as such decrease in hemoglobin.  Plan for admission for further evaluation and management.  ____________________________________  FINAL CLINICAL IMPRESSION(S) / ED DIAGNOSES  Final diagnoses:  Gastrointestinal  hemorrhage, unspecified gastrointestinal hemorrhage type     MEDICATIONS GIVEN DURING THIS VISIT:  Medications  ondansetron (ZOFRAN) injection 4 mg (has no administration in time range)  morphine 2 MG/ML injection 2 mg (2 mg Intravenous Given 10/09/19 0524)  ondansetron (ZOFRAN) injection 4 mg (4 mg Intravenous Given 10/09/19 0524)  iohexol (OMNIPAQUE) 300 MG/ML solution 75 mL (75 mLs Intravenous Contrast Given 10/09/19 0542)     ED Discharge Orders    None      *Please note:  Ann Welch was evaluated in Emergency Department on 10/09/2019 for the symptoms described in the history of present illness. She was evaluated in the context of the global COVID-19 pandemic, which necessitated consideration that the patient might be at risk for infection with the SARS-CoV-2 virus that causes COVID-19. Institutional protocols and algorithms that  pertain to the evaluation of patients at risk for COVID-19 are in a state of rapid change based on information released by regulatory bodies including the CDC and federal and state organizations. These policies and algorithms were followed during the patient's care in the ED.  Some ED evaluations and interventions may be delayed as a result of limited staffing during and after the pandemic.*  Note:  This document was prepared using Dragon voice recognition software and may include unintentional dictation errors.   Darci CurrentBrown, Cheval N, MD 10/09/19 [phone]

## 2019-10-09 NOTE — ED Notes (Signed)
Pt assisted in brushing teeth and dentures.

## 2019-10-10 ENCOUNTER — Encounter: Payer: Self-pay | Admitting: Internal Medicine

## 2019-10-10 ENCOUNTER — Observation Stay: Payer: Medicare (Managed Care) | Admitting: Anesthesiology

## 2019-10-10 ENCOUNTER — Encounter
Admission: EM | Disposition: A | Discharge status: Home / Self Care | Payer: Self-pay | Source: Home / Self Care | Attending: Emergency Medicine

## 2019-10-10 DIAGNOSIS — I482 Chronic atrial fibrillation, unspecified: Secondary | ICD-10-CM | POA: Diagnosis not present

## 2019-10-10 DIAGNOSIS — K922 Gastrointestinal hemorrhage, unspecified: Secondary | ICD-10-CM | POA: Diagnosis not present

## 2019-10-10 DIAGNOSIS — I10 Essential (primary) hypertension: Secondary | ICD-10-CM | POA: Diagnosis not present

## 2019-10-10 HISTORY — PX: COLONOSCOPY WITH PROPOFOL: SHX5780

## 2019-10-10 LAB — CBC
HCT: 37.9 % (ref 36.0–46.0)
HCT: 37.1 % (ref 36.0–46.0)
HCT: 38.2 % (ref 36.0–46.0)
HCT: 35.1 % — ABNORMAL LOW (ref 36.0–46.0)
Hemoglobin: 13 g/dL (ref 12.0–15.0)
Hemoglobin: 12.6 g/dL (ref 12.0–15.0)
Hemoglobin: 13 g/dL (ref 12.0–15.0)
Hemoglobin: 11.9 g/dL — ABNORMAL LOW (ref 12.0–15.0)
MCH: 31.3 pg (ref 26.0–34.0)
MCH: 31 pg (ref 26.0–34.0)
MCH: 31 pg (ref 26.0–34.0)
MCH: 30.8 pg (ref 26.0–34.0)
MCHC: 34.3 g/dL (ref 30.0–36.0)
MCHC: 34 g/dL (ref 30.0–36.0)
MCHC: 34 g/dL (ref 30.0–36.0)
MCHC: 33.9 g/dL (ref 30.0–36.0)
MCV: 91.3 fL (ref 80.0–100.0)
MCV: 91.4 fL (ref 80.0–100.0)
MCV: 91 fL (ref 80.0–100.0)
MCV: 90.9 fL (ref 80.0–100.0)
Platelets: 157 10*3/uL (ref 150–400)
Platelets: 171 10*3/uL (ref 150–400)
Platelets: 157 10*3/uL (ref 150–400)
Platelets: 160 10*3/uL (ref 150–400)
RBC: 4.15 MIL/uL (ref 3.87–5.11)
RBC: 4.06 MIL/uL (ref 3.87–5.11)
RBC: 4.2 MIL/uL (ref 3.87–5.11)
RBC: 3.86 MIL/uL — ABNORMAL LOW (ref 3.87–5.11)
RDW: 12.8 % (ref 11.5–15.5)
RDW: 12.8 % (ref 11.5–15.5)
RDW: 12.7 % (ref 11.5–15.5)
RDW: 12.7 % (ref 11.5–15.5)
WBC: 6.9 10*3/uL (ref 4.0–10.5)
WBC: 6.4 10*3/uL (ref 4.0–10.5)
WBC: 6.2 10*3/uL (ref 4.0–10.5)
WBC: 6 10*3/uL (ref 4.0–10.5)
nRBC: 0 % (ref 0.0–0.2)
nRBC: 0 % (ref 0.0–0.2)
nRBC: 0 % (ref 0.0–0.2)
nRBC: 0 % (ref 0.0–0.2)

## 2019-10-10 LAB — GLUCOSE, CAPILLARY: Glucose-Capillary: 107 mg/dL — ABNORMAL HIGH (ref 70–99)

## 2019-10-10 LAB — BASIC METABOLIC PANEL
Anion gap: 8 (ref 5–15)
BUN: 8 mg/dL (ref 8–23)
CO2: 26 mmol/L (ref 22–32)
Calcium: 8.9 mg/dL (ref 8.9–10.3)
Chloride: 102 mmol/L (ref 98–111)
Creatinine, Ser: 0.68 mg/dL (ref 0.44–1.00)
GFR calc Af Amer: >60 mL/min (ref >60)
GFR calc non Af Amer: >60 mL/min (ref >60)
Glucose, Bld: 115 mg/dL — ABNORMAL HIGH (ref 70–99)
Potassium: 3.7 mmol/L (ref 3.5–5.1)
Sodium: 136 mmol/L (ref 135–145)

## 2019-10-10 SURGERY — COLONOSCOPY WITH PROPOFOL
Anesthesia: General

## 2019-10-10 MED ORDER — DOCUSATE SODIUM 100 MG PO CAPS
200.0000 mg | ORAL_CAPSULE | Freq: Two times a day (BID) | ORAL | Status: DC
  Administered 2019-10-11 – 2019-10-12 (×3): 200 mg via ORAL
  Filled 2019-10-10 (×3): qty 2

## 2019-10-10 MED ORDER — LIDOCAINE HCL (CARDIAC) PF 100 MG/5ML IV SOSY
PREFILLED_SYRINGE | INTRAVENOUS | Status: DC | PRN
  Administered 2019-10-10: 50 mg via INTRAVENOUS

## 2019-10-10 MED ORDER — PROPOFOL 500 MG/50ML IV EMUL
INTRAVENOUS | Status: DC | PRN
  Administered 2019-10-10: 100 ug/kg/min via INTRAVENOUS

## 2019-10-10 MED ORDER — PROPOFOL 10 MG/ML IV BOLUS
INTRAVENOUS | Status: DC | PRN
  Administered 2019-10-10: 70 mg via INTRAVENOUS

## 2019-10-10 NOTE — Care Management Obs Status (Signed)
MEDICARE OBSERVATION STATUS NOTIFICATION   Patient Details  Name: Ann Welch MRN: [id number] Date of Birth: 1931/08/01   Medicare Observation Status Notification Given:  Yes    Margarito Liner, LCSW 10/10/2019, 4:38 PM

## 2019-10-10 NOTE — Op Note (Signed)
[facility] Patient Name: Ann Welch Procedure Date: 10/10/2019 2:35 PM MRN: [id number] Account #: [id number] Date of Birth: 12-Dec-1931 Admit Type: Inpatient Age: 86 Room: [facility] ENDO ROOM 4 Gender: Female Note Status: Finalized Procedure:             Colonoscopy Indications:           Rectal bleeding Providers:             [name] MD, MD Referring MD:          No Local Md, MD (Referring MD) Medicines:             Monitored Anesthesia Care Complications:         No immediate complications. Procedure:             Pre-Anesthesia Assessment:                        - Prior to the procedure, a History and Physical was                         performed, and patient medications, allergies and                         sensitivities were reviewed. The patient's tolerance                         of previous anesthesia was reviewed.                        - The risks and benefits of the procedure and the                         sedation options and risks were discussed with the                         patient. All questions were answered and informed                         consent was obtained.                        - ASA Grade Assessment: III - A patient with severe                         systemic disease.                        After obtaining informed consent, the colonoscope was                         passed under direct vision. Throughout the procedure,                         the patient's blood pressure, pulse, and oxygen                         saturations were monitored continuously. The                         Colonoscope was introduced through the anus and  advanced to the the cecum, identified by the                         appendiceal orifice. The colonoscopy was performed                         with ease. The patient tolerated the procedure well.                         The quality of the bowel preparation was  adequate. Findings:      The perianal and digital rectal examinations were normal.      Non-bleeding internal hemorrhoids were found during retroflexion. The       hemorrhoids were medium-sized and Grade I (internal hemorrhoids that do       not prolapse).      The exam was otherwise without abnormality on direct and retroflexion       views. Impression:            - Non-bleeding internal hemorrhoids.                        - The examination was otherwise normal on direct and                         retroflexion views.                        - No specimens collected. Recommendation:        - Return patient to hospital ward for ongoing care.                        - Advance diet as tolerated.                        - Continue present medications. Procedure Code(s):     --- Professional ---                        [phone], Colonoscopy, flexible; diagnostic, including                         collection of specimen(s) by brushing or washing, when                         performed (separate procedure) Diagnosis Code(s):     --- Professional ---                        K64.0, First degree hemorrhoids                        K62.5, Hemorrhage of anus and rectum CPT copyright 2019 [employer]. All rights reserved. The codes documented in this report are preliminary and upon coder review may  be revised to meet current compliance requirements. [name], MD [name] MD, MD 10/10/2019 3:35:29 PM This report has been signed electronically. Number of Addenda: 0 Note Initiated On: 10/10/2019 2:35 PM Scope Withdrawal Time: 0 hours 9 minutes 47 seconds  Total Procedure Duration: 0 hours 16 minutes 56 seconds  Estimated Blood Loss:  Estimated blood loss: none.      [place]  [facility]

## 2019-10-10 NOTE — Progress Notes (Signed)
PROGRESS NOTE    Ann Welch  HQI:[id number] DOB: March 23, 1931 DOA: 10/09/2019 PCP: [facility]  Assessment & Plan:   Principal Problem:   GIB (gastrointestinal bleeding) Active Problems:   COPD (chronic obstructive pulmonary disease) (HCC)   GERD (gastroesophageal reflux disease)   Hypertension   Lesion of endometrium   Atrial fibrillation, chronic (HCC)   GI bleed: continue on IV PPI. Continue to hold home dose of aspirin. Colonoscopy today as per GI. Continue to monitor H&H. No need for a transfusion at this time    COPD: w/o excerebration. Continue on bronchodilators and encourage incentive spirometry    GERD: continue on PPI  HTN: continue on home dose of metoprolol. IV hydralazine prn   Chronic a. fib: continue on home dose of metoprolol. Not on anticoagulation at home  Lesion of endometrium: incidental findings by CT scan. Will need f/u outpatient w/ OBGYN   DVT prophylaxis: SCDs Code Status: DNR Family Communication:  Disposition Plan: likely d/c back home, will go for a colonoscopy today.     Consultants:   GI   Procedures:    Antimicrobials:    Subjective: Pt c/o malaise. Pt denies any dark stools today as she has not had a bowel movement yet today   Objective: Vitals:   10/09/19 0830 10/09/19 1200 10/09/19 2104 10/10/19 0352  BP: 126/69 (!) 149/73 (!) 150/82 (!) 143/76  Pulse: 63 85 88 78  Resp:  18 18 16   Temp:   98 F (36.7 C) 98.2 F (36.8 C)  TempSrc:   Oral Oral  SpO2: 96% 96% 98% 100%  Weight:      Height:        Intake/Output Summary (Last 24 hours) at 10/10/2019 0747 Last data filed at 10/10/2019 0330 Gross per 24 hour  Intake 1650 ml  Output --  Net 1650 ml   Filed Weights   10/08/19 2104  Weight: 60.8 kg    Examination:  General exam: Appears calm and comfortable  Respiratory system: clear breath sounds b/l  Cardiovascular system: S1 & S2 +. No rubs, gallops or clicks.   Gastrointestinal system: Abdomen is nondistended, soft and nontender. Hyperactive bowel sounds heard. Central nervous system: Alert and oriented. Moves all 4 extremities Psychiatry: Judgement and insight appear normal. Flat mood and affect     Data Reviewed: I have personally reviewed following labs and imaging studies  CBC: Recent Labs  Lab 10/08/19 2117 10/08/19 2117 10/09/19 0628 10/09/19 0839 10/09/19 1425 10/09/19 1922 10/10/19 0116  WBC 9.6  --   --  7.6 8.7 9.1 6.9  HGB 14.4   < > 13.6 13.4 13.3 13.5 13.0  HCT 41.8   < > 39.1 39.1 38.8 39.7 37.9  MCV 89.3  --   --  90.3 90.7 91.1 91.3  PLT 184  --   --  173 173 188 157   < > = values in this interval not displayed.   Basic Metabolic Panel: Recent Labs  Lab 10/08/19 2117 10/10/19 0116  NA 137 136  K 3.9 3.7  CL 99 102  CO2 27 26  GLUCOSE 122* 115*  BUN 8 8  CREATININE 0.56 0.68  CALCIUM 10.0 8.9   GFR: Estimated Creatinine Clearance: 41.8 mL/min (by C-G formula based on SCr of 0.68 mg/dL). Liver Function Tests: Recent Labs  Lab 10/08/19 2117  AST 25  ALT 32  ALKPHOS 73  BILITOT 0.9  PROT 7.6  ALBUMIN 4.7   Recent  Labs  Lab 10/08/19 2117  LIPASE 27   No results for input(s): AMMONIA in the last 168 hours. Coagulation Profile: Recent Labs  Lab 10/09/19 0839  INR 1.0   Cardiac Enzymes: No results for input(s): CKTOTAL, CKMB, CKMBINDEX, TROPONINI in the last 168 hours. BNP (last 3 results) No results for input(s): PROBNP in the last 8760 hours. HbA1C: No results for input(s): HGBA1C in the last 72 hours. CBG: Recent Labs  Lab 10/10/19 0737  GLUCAP 107*   Lipid Profile: No results for input(s): CHOL, HDL, LDLCALC, TRIG, CHOLHDL, LDLDIRECT in the last 72 hours. Thyroid Function Tests: No results for input(s): TSH, T4TOTAL, FREET4, T3FREE, THYROIDAB in the last 72 hours. Anemia Panel: No results for input(s): VITAMINB12, FOLATE, FERRITIN, TIBC, IRON, RETICCTPCT in the last 72  hours. Sepsis Labs: No results for input(s): PROCALCITON, LATICACIDVEN in the last 168 hours.  Recent Results (from the past 240 hour(s))  SARS Coronavirus 2 by RT PCR (hospital order, performed in [facility] hospital lab) Nasopharyngeal Nasopharyngeal Swab     Status: None   Collection Time: 10/09/19  7:31 AM   Specimen: Nasopharyngeal Swab  Result Value Ref Range Status   SARS Coronavirus 2 NEGATIVE NEGATIVE Final    Comment: (NOTE) SARS-CoV-2 target nucleic acids are NOT DETECTED.  The SARS-CoV-2 RNA is generally detectable in upper and lower respiratory specimens during the acute phase of infection. The lowest concentration of SARS-CoV-2 viral copies this assay can detect is 250 copies / mL. A negative result does not preclude SARS-CoV-2 infection and should not be used as the sole basis for treatment or other patient management decisions.  A negative result may occur with improper specimen collection / handling, submission of specimen other than nasopharyngeal swab, presence of viral mutation(s) within the areas targeted by this assay, and inadequate number of viral copies (<250 copies / mL). A negative result must be combined with clinical observations, patient history, and epidemiological information.  Fact Sheet for Patients:   BoilerBrush.com.cy  Fact Sheet for Healthcare Providers: https://pope.com/  This test is not yet approved or  cleared by the [place] FDA and has been authorized for detection and/or diagnosis of SARS-CoV-2 by FDA under an Emergency Use Authorization (EUA).  This EUA will remain in effect (meaning this test can be used) for the duration of the COVID-19 declaration under Section 564(b)(1) of the Act, 21 U.S.C. section 360bbb-3(b)(1), unless the authorization is terminated or revoked sooner.  Performed at [facility], [address] [place], [place] [address]           Radiology Studies: CT ABDOMEN PELVIS W CONTRAST  Result Date: 10/09/2019 CLINICAL DATA:  Constipation. Nausea and lower abdominal pain. EXAM: CT ABDOMEN AND PELVIS WITH CONTRAST TECHNIQUE: Multidetector CT imaging of the abdomen and pelvis was performed using the standard protocol following bolus administration of intravenous contrast. CONTRAST:  14mL OMNIPAQUE IOHEXOL 300 MG/ML  SOLN COMPARISON:  None. FINDINGS: Lower chest: The lung bases are clear except for dependent subpleural atelectasis on the right side. No infiltrates or effusions. The heart is normal in size. Minimal scattered aortic calcifications. Small hiatal hernia. Hepatobiliary: Numerous hepatic cysts some of which demonstrate septations and rim like calcifications. No worrisome hepatic lesions or intrahepatic biliary dilatation. The gallbladder is unremarkable. No common bile duct dilatation. The portal and hepatic veins are patent. Pancreas: No mass, inflammation or ductal dilatation. Spleen: Normal size. No focal lesions. Adrenals/Urinary Tract: The adrenal glands and kidneys are unremarkable. No worrisome renal lesions,  renal calculi or hydroureteronephrosis. The bladder is unremarkable. Stomach/Bowel: Stomach, duodenum, small bowel and colon are grossly normal without oral contrast. No acute inflammatory changes, mass lesions or obstructive findings. The terminal ileum is normal. The appendix is normal. Vascular/Lymphatic: Scattered aortic calcifications but no aneurysm or dissection. The branch vessels are patent. The major venous structures are patent. No mesenteric or retroperitoneal mass or adenopathy. Reproductive: Small focus of low attenuation in the region of the mid endometrium measuring a maximum of 9.5 mm. This could be focal thickening of the endometrium or possibly a small endometrial lesion or submucosal fibroid. Pelvic ultrasound examination is suggested for further evaluation. Both ovaries are normal. Other: No  pelvic mass or adenopathy. No free pelvic fluid collections. No inguinal mass or adenopathy. No abdominal wall hernia or subcutaneous lesions. Musculoskeletal: Remote compression fractures of L1 and L2. Mild retropulsion at L2. Degenerative lumbar spondylosis with disc disease and facet disease. Left-sided pars defect noted at L4. No acute bony findings or destructive bony changes. IMPRESSION: 1. No acute abdominal/pelvic findings, mass lesions or adenopathy. 2. Numerous hepatic cysts. 3. 9.5 mm focus of low attenuation in the region of the mid endometrium could be focal thickening of the endometrium or possibly a small endometrial lesion or submucosal fibroid. Pelvic ultrasound examination is suggested for further evaluation. Electronically Signed   By: Rudie Meyer M.D.   On: 10/09/2019 06:19        Scheduled Meds: . cholecalciferol  1,000 Units Oral Daily  . melatonin  10 mg Oral QHS  . metoprolol succinate  25 mg Oral QHS  . multivitamin with minerals  1 tablet Oral Daily  . pantoprazole (PROTONIX) IV  40 mg Intravenous Q12H  . traZODone  25 mg Oral QHS  . vitamin E  400 Units Oral Daily   Continuous Infusions: . sodium chloride 75 mL/hr at 10/09/19 2145  . sodium chloride Stopped (10/10/19 0733)     LOS: 0 days    Time spent: 31 mins     Charise Killian, MD [facility] Pager [phone]  If 7PM-7AM, please contact night-coverage [email or web address] 10/10/2019, 7:47 AM

## 2019-10-10 NOTE — Transfer of Care (Signed)
Immediate Anesthesia Transfer of Care Note  Patient: Ann Welch  Procedure(s) Performed: COLONOSCOPY WITH PROPOFOL (N/A )  Patient Location: Endoscopy Unit  Anesthesia Type:General  Level of Consciousness: drowsy and patient cooperative  Airway & Oxygen Therapy: Patient Spontanous Breathing  Post-op Assessment: Report given to RN and Post -op Vital signs reviewed and stable  Post vital signs: Reviewed and stable  Last Vitals:  Vitals Value Taken Time  BP 102/52 10/10/19 1537  Temp 36.1 C 10/10/19 1536  Pulse 71 10/10/19 1541  Resp 23 10/10/19 1541  SpO2 94 % 10/10/19 1541  Vitals shown include unvalidated device data.  Last Pain:  Vitals:   10/10/19 1536  TempSrc: Temporal  PainSc: Asleep      Patients Stated Pain Goal: 0 (10/10/19 0352)  Complications: No complications documented.

## 2019-10-10 NOTE — Anesthesia Preprocedure Evaluation (Signed)
Anesthesia Evaluation  Patient identified by MRN, date of birth, ID band Patient awake    Reviewed: Allergy & Precautions, NPO status , Patient's Chart, lab work & pertinent test results  History of Anesthesia Complications Negative for: history of anesthetic complications  Airway Mallampati: II  TM Distance: >3 FB Neck ROM: Full    Dental  (+) Poor Dentition, Missing   Pulmonary sleep apnea and Continuous Positive Airway Pressure Ventilation , COPD,    breath sounds clear to auscultation- rhonchi (-) wheezing      Cardiovascular hypertension, Pt. on medications (-) CAD, (-) Past MI, (-) Cardiac Stents and (-) CABG + dysrhythmias Atrial Fibrillation  Rhythm:Regular Rate:Normal - Systolic murmurs and - Diastolic murmurs    Neuro/Psych  Headaches, neg Seizures Anxiety    GI/Hepatic Neg liver ROS, GERD  ,  Endo/Other  negative endocrine ROSneg diabetes  Renal/GU negative Renal ROS     Musculoskeletal  (+) Arthritis ,   Abdominal (+) - obese,   Peds  Hematology negative hematology ROS (+)   Anesthesia Other Findings Past Medical History: No date: Anginal pain (HCC) No date: Anxiety No date: Arthritis No date: COPD (chronic obstructive pulmonary disease) (HCC) No date: Dyspnea No date: Dysrhythmia     Comment:  Hx of A-Fib No date: GERD (gastroesophageal reflux disease) No date: Headache     Comment:  1/week 05/05/2017: History of recent hospitalization     Comment:  In [place] for low BP and angina with Heart Cath, no               significant findings, Surgical clearance in chart               [facility] No date: HOH (hard of hearing)     Comment:  right ear No date: Hypertension No date: Liver mass No date: Meniere disease No date: Orthopnea No date: Restless leg syndrome No date: Sleep apnea     Comment:  CPAP No date: Wears dentures     Comment:  upper    Reproductive/Obstetrics                             Anesthesia Physical Anesthesia Plan  ASA: III  Anesthesia Plan: General   Post-op Pain Management:    Induction: Intravenous  PONV Risk Score and Plan: 2 and Propofol infusion  Airway Management Planned: Natural Airway  Additional Equipment:   Intra-op Plan:   Post-operative Plan:   Informed Consent: I have reviewed the patients History and Physical, chart, labs and discussed the procedure including the risks, benefits and alternatives for the proposed anesthesia with the patient or authorized representative who has indicated his/her understanding and acceptance.   Patient has DNR.  Discussed DNR with patient and Suspend DNR.   Dental advisory given  Plan Discussed with: CRNA and Anesthesiologist  Anesthesia Plan Comments:         Anesthesia Quick Evaluation

## 2019-10-10 NOTE — Anesthesia Postprocedure Evaluation (Signed)
Anesthesia Post Note  Patient: Delorse Shane  Procedure(s) Performed: COLONOSCOPY WITH PROPOFOL (N/A )  Patient location during evaluation: Endoscopy Anesthesia Type: General Level of consciousness: awake and alert Pain management: pain level controlled Vital Signs Assessment: post-procedure vital signs reviewed and stable Respiratory status: spontaneous breathing, nonlabored ventilation, respiratory function stable and patient connected to nasal cannula oxygen Cardiovascular status: blood pressure returned to baseline and stable Postop Assessment: no apparent nausea or vomiting Anesthetic complications: no   No complications documented.   Last Vitals:  Vitals:   10/10/19 1558 10/10/19 1621  BP: (!) 131/58 (!) 134/59  Pulse: 60 (!) 58  Resp: 17 20  Temp:  36.9 C  SpO2: 98% 98%    Last Pain:  Vitals:   10/10/19 1621  TempSrc: Oral  PainSc:                  Corinda Gubler

## 2019-10-11 ENCOUNTER — Encounter: Payer: Self-pay | Admitting: Gastroenterology

## 2019-10-11 DIAGNOSIS — I482 Chronic atrial fibrillation, unspecified: Secondary | ICD-10-CM | POA: Diagnosis not present

## 2019-10-11 DIAGNOSIS — K922 Gastrointestinal hemorrhage, unspecified: Secondary | ICD-10-CM | POA: Diagnosis not present

## 2019-10-11 LAB — BASIC METABOLIC PANEL
Anion gap: 8 (ref 5–15)
BUN: 8 mg/dL (ref 8–23)
CO2: 25 mmol/L (ref 22–32)
Calcium: 8.8 mg/dL — ABNORMAL LOW (ref 8.9–10.3)
Chloride: 102 mmol/L (ref 98–111)
Creatinine, Ser: 0.52 mg/dL (ref 0.44–1.00)
GFR calc Af Amer: >60 mL/min (ref >60)
GFR calc non Af Amer: >60 mL/min (ref >60)
Glucose, Bld: 103 mg/dL — ABNORMAL HIGH (ref 70–99)
Potassium: 3.7 mmol/L (ref 3.5–5.1)
Sodium: 135 mmol/L (ref 135–145)

## 2019-10-11 LAB — CBC
HCT: 34.5 % — ABNORMAL LOW (ref 36.0–46.0)
Hemoglobin: 11.9 g/dL — ABNORMAL LOW (ref 12.0–15.0)
MCH: 31.5 pg (ref 26.0–34.0)
MCHC: 34.5 g/dL (ref 30.0–36.0)
MCV: 91.3 fL (ref 80.0–100.0)
Platelets: 154 10*3/uL (ref 150–400)
RBC: 3.78 MIL/uL — ABNORMAL LOW (ref 3.87–5.11)
RDW: 12.8 % (ref 11.5–15.5)
WBC: 6.9 10*3/uL (ref 4.0–10.5)
nRBC: 0 % (ref 0.0–0.2)

## 2019-10-11 LAB — GLUCOSE, CAPILLARY: Glucose-Capillary: 90 mg/dL (ref 70–99)
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
## 2019-10-11 NOTE — Progress Notes (Addendum)
Progress Note    Ann Welch  HEN:[id number] DOB: 06-24-1931  DOA: 10/09/2019 PCP: [facility]      Brief Narrative:    Medical records reviewed and are as summarized below:  Ann Welch is a 86 y.o. female       Assessment/Plan:   Principal Problem:   GIB (gastrointestinal bleeding) Active Problems:   COPD (chronic obstructive pulmonary disease) (HCC)   GERD (gastroesophageal reflux disease)   Hypertension   Lesion of endometrium   Atrial fibrillation, chronic (HCC) Dizziness Nausea  PLAN  Continue IV Protonix Discontinue IV fluids Continue metoprolol for hypertension and A. fib Checked orthostatic vital signs but this was negative. Monitor overnight and discharge home tomorrow if there are no acute issues. Plan discussed with Lennette Bihari at [phone] (nurse practitioner with the [facility]).  Body mass index is 23.74 kg/m.  Diet Order            Diet Heart Room service appropriate? Yes; Fluid consistency: Thin  Diet effective now                    Consultants:  Gastroenterologist  Procedures:  Colonoscopy    Medications:   . cholecalciferol  1,000 Units Oral Daily  . docusate sodium  200 mg Oral BID  . melatonin  10 mg Oral QHS  . metoprolol succinate  25 mg Oral QHS  . multivitamin with minerals  1 tablet Oral Daily  . pantoprazole (PROTONIX) IV  40 mg Intravenous Q12H  . traZODone  25 mg Oral QHS  . vitamin E  400 Units Oral Daily   Continuous Infusions: . sodium chloride 75 mL/hr at 10/11/19 0332  . sodium chloride 1,000 mL (10/10/19 1441)     Anti-infectives (From admission, onward)   None             Family Communication/Anticipated D/C date and plan/Code Status   DVT prophylaxis: Place and maintain sequential compression device Start: 10/10/19 0753 SCDs Start: 10/09/19 0846     Code Status: DNR  Family Communication: Plan discussed with  patient Disposition Plan:    Status is: Observation  The patient remains OBS appropriate and will d/c before 2 midnights.  Dispo: The patient is from: Home              Anticipated d/c is to: Home              Anticipated d/c date is: 1 day              Patient currently is not medically stable to d/c.           Subjective:   C/o nausea, dizziness and generalized weakness after working with physical therapist this morning.  No abdominal pain no vomiting.  No shortness of breath or chest pain.  Objective:    Vitals:   10/11/19 1052 10/11/19 1054 10/11/19 1055 10/11/19 1056  BP: (!) 151/76 (!) 154/79 (!) 141/78 (!) 154/86  Pulse: 68 71 81 82  Resp: 17     Temp: 98.2 F (36.8 C)     TempSrc: Oral     SpO2: 96%     Weight:      Height:       Orthostatic VS for the past 24 hrs:  BP- Lying Pulse- Lying BP- Sitting Pulse- Sitting BP- Standing at 0 minutes Pulse- Standing at 0 minutes  10/11/19 1100 151/76 68 154/79 71 141/78  81     Intake/Output Summary (Last 24 hours) at 10/11/2019 1601 Last data filed at 10/11/2019 1453 Gross per 24 hour  Intake 1522.16 ml  Output 3350 ml  Net -1827.84 ml   Filed Weights   10/08/19 2104 10/10/19 1437  Weight: 60.8 kg 60.8 kg    Exam:  GEN: NAD SKIN: No rash EYES: EOMI ENT: MMM CV: RRR PULM: CTA B ABD: soft, ND, NT, +BS CNS: AAO x 3, non focal EXT: No edema or tenderness   Data Reviewed:   I have personally reviewed following labs and imaging studies:  Labs: Labs show the following:   Basic Metabolic Panel: Recent Labs  Lab 10/08/19 2117 10/08/19 2117 10/10/19 0116 10/11/19 0539  NA 137  --  136 135  K 3.9   < > 3.7 3.7  CL 99  --  102 102  CO2 27  --  26 25  GLUCOSE 122*  --  115* 103*  BUN 8  --  8 8  CREATININE 0.56  --  0.68 0.52  CALCIUM 10.0  --  8.9 8.8*   < > = values in this interval not displayed.   GFR Estimated Creatinine Clearance: 41.8 mL/min (by C-G formula based on SCr of 0.52  mg/dL). Liver Function Tests: Recent Labs  Lab 10/08/19 2117  AST 25  ALT 32  ALKPHOS 73  BILITOT 0.9  PROT 7.6  ALBUMIN 4.7   Recent Labs  Lab 10/08/19 2117  LIPASE 27   No results for input(s): AMMONIA in the last 168 hours. Coagulation profile Recent Labs  Lab 10/09/19 0839  INR 1.0    CBC: Recent Labs  Lab 10/10/19 0116 10/10/19 0743 10/10/19 1314 10/10/19 1918 10/11/19 0123  WBC 6.9 6.4 6.2 6.0 6.9  HGB 13.0 12.6 13.0 11.9* 11.9*  HCT 37.9 37.1 38.2 35.1* 34.5*  MCV 91.3 91.4 91.0 90.9 91.3  PLT 157 171 157 160 154   Cardiac Enzymes: No results for input(s): CKTOTAL, CKMB, CKMBINDEX, TROPONINI in the last 168 hours. BNP (last 3 results) No results for input(s): PROBNP in the last 8760 hours. CBG: Recent Labs  Lab 10/10/19 0737 10/11/19 0743  GLUCAP 107* 90   D-Dimer: No results for input(s): DDIMER in the last 72 hours. Hgb A1c: No results for input(s): HGBA1C in the last 72 hours. Lipid Profile: No results for input(s): CHOL, HDL, LDLCALC, TRIG, CHOLHDL, LDLDIRECT in the last 72 hours. Thyroid function studies: No results for input(s): TSH, T4TOTAL, T3FREE, THYROIDAB in the last 72 hours.  Invalid input(s): FREET3 Anemia work up: No results for input(s): VITAMINB12, FOLATE, FERRITIN, TIBC, IRON, RETICCTPCT in the last 72 hours. Sepsis Labs: Recent Labs  Lab 10/10/19 0743 10/10/19 1314 10/10/19 1918 10/11/19 0123  WBC 6.4 6.2 6.0 6.9    Microbiology Recent Results (from the past 240 hour(s))  SARS Coronavirus 2 by RT PCR (hospital order, performed in [facility] hospital lab) Nasopharyngeal Nasopharyngeal Swab     Status: None   Collection Time: 10/09/19  7:31 AM   Specimen: Nasopharyngeal Swab  Result Value Ref Range Status   SARS Coronavirus 2 NEGATIVE NEGATIVE Final    Comment: (NOTE) SARS-CoV-2 target nucleic acids are NOT DETECTED.  The SARS-CoV-2 RNA is generally detectable in upper and lower respiratory specimens during  the acute phase of infection. The lowest concentration of SARS-CoV-2 viral copies this assay can detect is 250 copies / mL. A negative result does not preclude SARS-CoV-2 infection and should not be used as  the sole basis for treatment or other patient management decisions.  A negative result may occur with improper specimen collection / handling, submission of specimen other than nasopharyngeal swab, presence of viral mutation(s) within the areas targeted by this assay, and inadequate number of viral copies (<250 copies / mL). A negative result must be combined with clinical observations, patient history, and epidemiological information.  Fact Sheet for Patients:   BoilerBrush.com.cy  Fact Sheet for Healthcare Providers: https://pope.com/  This test is not yet approved or  cleared by the [place] FDA and has been authorized for detection and/or diagnosis of SARS-CoV-2 by FDA under an Emergency Use Authorization (EUA).  This EUA will remain in effect (meaning this test can be used) for the duration of the COVID-19 declaration under Section 564(b)(1) of the Act, 21 U.S.C. section 360bbb-3(b)(1), unless the authorization is terminated or revoked sooner.  Performed at [facility], [address] [place], [place] [address]     Procedures and diagnostic studies:  No results found.             LOS: 0 days   Stevee Valenta  [facility]   Pager on [email or web address]. If 7PM-7AM, please contact night-coverage at [email or web address]     10/11/2019, 4:01 PM

## 2019-10-11 NOTE — Progress Notes (Signed)
Pt wants CPAP mask off. CPAP removed.

## 2019-10-12 DIAGNOSIS — I482 Chronic atrial fibrillation, unspecified: Secondary | ICD-10-CM | POA: Diagnosis not present

## 2019-10-12 DIAGNOSIS — K922 Gastrointestinal hemorrhage, unspecified: Secondary | ICD-10-CM | POA: Diagnosis not present

## 2019-10-12 DIAGNOSIS — N858 Other specified noninflammatory disorders of uterus: Secondary | ICD-10-CM

## 2019-10-12 LAB — GLUCOSE, CAPILLARY
Glucose-Capillary: 100 mg/dL — ABNORMAL HIGH (ref 70–99)
Glucose-Capillary: 120 mg/dL — ABNORMAL HIGH (ref 70–99)

## 2019-10-12 NOTE — Plan of Care (Signed)
The patient has been discharged via [facility] transportation system. IV removed. No falls. Vital signs stable upon discharge.  Problem: Education: Goal: Ability to identify signs and symptoms of gastrointestinal bleeding will improve Outcome: Completed/Met   Problem: Bowel/Gastric: Goal: Will show no signs and symptoms of gastrointestinal bleeding Outcome: Completed/Met   Problem: Fluid Volume: Goal: Will show no signs and symptoms of excessive bleeding Outcome: Completed/Met   Problem: Clinical Measurements: Goal: Complications related to the disease process, condition or treatment will be avoided or minimized Outcome: Completed/Met   Problem: Education: Goal: Knowledge of General Education information will improve Description: Including pain rating scale, medication(s)/side effects and non-pharmacologic comfort measures Outcome: Completed/Met   Problem: Health Behavior/Discharge Planning: Goal: Ability to manage health-related needs will improve Outcome: Completed/Met   Problem: Clinical Measurements: Goal: Ability to maintain clinical measurements within normal limits will improve Outcome: Completed/Met Goal: Will remain free from infection Outcome: Completed/Met Goal: Diagnostic test results will improve Outcome: Completed/Met Goal: Respiratory complications will improve Outcome: Completed/Met Goal: Cardiovascular complication will be avoided Outcome: Completed/Met   Problem: Activity: Goal: Risk for activity intolerance will decrease Outcome: Completed/Met   Problem: Nutrition: Goal: Adequate nutrition will be maintained Outcome: Completed/Met   Problem: Coping: Goal: Level of anxiety will decrease Outcome: Completed/Met   Problem: Elimination: Goal: Will not experience complications related to bowel motility Outcome: Completed/Met Goal: Will not experience complications related to urinary retention Outcome: Completed/Met   Problem: Pain Managment: Goal:  General experience of comfort will improve Outcome: Completed/Met   Problem: Safety: Goal: Ability to remain free from injury will improve Outcome: Completed/Met   Problem: Skin Integrity: Goal: Risk for impaired skin integrity will decrease Outcome: Completed/Met   Problem: Acute Rehab PT Goals(only PT should resolve) Goal: Patient Will Transfer Sit To/From Stand Outcome: Completed/Met Goal: Pt Will Ambulate Outcome: Completed/Met Goal: Pt Will Go Up/Down Stairs Outcome: Completed/Met

## 2019-10-12 NOTE — Care Management (Signed)
Spoke with [facility] RN, Wayne Both, at [phone]. [facility] will be picking the patient up at discharge today between 2:15 and 2:30. They will take the patient to the [facility] center first before taking her home

## 2019-10-12 NOTE — Discharge Summary (Signed)
Physician Discharge Summary  Ann Welch XIH:[id number] DOB: 09/15/1931 DOA: 10/09/2019  PCP: [facility]  Admit date: 10/09/2019 Discharge date: 10/12/2019  Discharge disposition: Home   Recommendations for Outpatient Follow-Up:   Follow up with PCP as soon as possible   Discharge Diagnosis:   Principal Problem:   GIB (gastrointestinal bleeding) Active Problems:   COPD (chronic obstructive pulmonary disease) (HCC)   GERD (gastroesophageal reflux disease)   Hypertension   Lesion of endometrium   Atrial fibrillation, chronic (HCC)    Discharge Condition: Stable.  Diet recommendation:  Diet Order            Diet - low sodium heart healthy           Diet Heart Room service appropriate? Yes; Fluid consistency: Thin  Diet effective now                   Code Status: DNR     Hospital Course:   [name] is a 86 y.o. female with medical history significant of hypertension, COPD, GERD, anxiety, C, RLS, Mnire's disease, hard of hearing, atrial fibrillation on baby aspirin.  She presented to the hospital with rectal bleeding, constipation, lower abdominal pain.  She was admitted to the hospital for acute GI bleeding.  She was treated with IV fluids and IV Protonix.  She was seen in consultation by the gastroenterologist.  She underwent colonoscopy on 10/10/2019 which showed nonbleeding internal hemorrhoids. She developed mild acute blood loss anemia.  Hemoglobin dropped from 14.4-11.9.  She did not require blood transfusion.  H&H has remained stable.  Abdominal pain and rectal bleeding have resolved. She is deemed stable for discharge to home today.  Of note, CT abdomen and pelvis showed endometrial lesion that requires follow-up with pelvic ultrasound in the outpatient setting.  Medical Consultants:    Gastroenterologist, Dr. [name]   Discharge Exam:    Vitals:   10/11/19 1056 10/11/19 2036 10/12/19 0406  10/12/19 0805  BP: (!) 154/86 131/68 132/72 131/75  Pulse: 82 72 (!) 58 71  Resp:  16 17 18   Temp:  98.3 F (36.8 C) 98 F (36.7 C) 98.2 F (36.8 C)  TempSrc:  Oral Oral Oral  SpO2:  97% 99% 95%  Weight:      Height:         GEN: NAD SKIN: No rash EYES: EOMI ENT: MMM CV: RRR PULM: CTA B ABD: soft, ND, NT, +BS CNS: AAO x 3, non focal EXT: No edema or tenderness   The results of significant diagnostics from this hospitalization (including imaging, microbiology, ancillary and laboratory) are listed below for reference.     Procedures and Diagnostic Studies:   CT ABDOMEN PELVIS W CONTRAST  Result Date: 10/09/2019 CLINICAL DATA:  Constipation. Nausea and lower abdominal pain. EXAM: CT ABDOMEN AND PELVIS WITH CONTRAST TECHNIQUE: Multidetector CT imaging of the abdomen and pelvis was performed using the standard protocol following bolus administration of intravenous contrast. CONTRAST:  20mL OMNIPAQUE IOHEXOL 300 MG/ML  SOLN COMPARISON:  None. FINDINGS: Lower chest: The lung bases are clear except for dependent subpleural atelectasis on the right side. No infiltrates or effusions. The heart is normal in size. Minimal scattered aortic calcifications. Small hiatal hernia. Hepatobiliary: Numerous hepatic cysts some of which demonstrate septations and rim like calcifications. No worrisome hepatic lesions or intrahepatic biliary dilatation. The gallbladder is unremarkable. No common bile duct dilatation. The portal and hepatic veins are patent.  Pancreas: No mass, inflammation or ductal dilatation. Spleen: Normal size. No focal lesions. Adrenals/Urinary Tract: The adrenal glands and kidneys are unremarkable. No worrisome renal lesions, renal calculi or hydroureteronephrosis. The bladder is unremarkable. Stomach/Bowel: Stomach, duodenum, small bowel and colon are grossly normal without oral contrast. No acute inflammatory changes, mass lesions or obstructive findings. The terminal ileum is normal.  The appendix is normal. Vascular/Lymphatic: Scattered aortic calcifications but no aneurysm or dissection. The branch vessels are patent. The major venous structures are patent. No mesenteric or retroperitoneal mass or adenopathy. Reproductive: Small focus of low attenuation in the region of the mid endometrium measuring a maximum of 9.5 mm. This could be focal thickening of the endometrium or possibly a small endometrial lesion or submucosal fibroid. Pelvic ultrasound examination is suggested for further evaluation. Both ovaries are normal. Other: No pelvic mass or adenopathy. No free pelvic fluid collections. No inguinal mass or adenopathy. No abdominal wall hernia or subcutaneous lesions. Musculoskeletal: Remote compression fractures of L1 and L2. Mild retropulsion at L2. Degenerative lumbar spondylosis with disc disease and facet disease. Left-sided pars defect noted at L4. No acute bony findings or destructive bony changes. IMPRESSION: 1. No acute abdominal/pelvic findings, mass lesions or adenopathy. 2. Numerous hepatic cysts. 3. 9.5 mm focus of low attenuation in the region of the mid endometrium could be focal thickening of the endometrium or possibly a small endometrial lesion or submucosal fibroid. Pelvic ultrasound examination is suggested for further evaluation. Electronically Signed   By: Rudie MeyerP.  Gallerani M.D.   On: 10/09/2019 06:19     Labs:   Basic Metabolic Panel: Recent Labs  Lab 10/08/19 2117 10/08/19 2117 10/10/19 0116 10/11/19 0539  NA 137  --  136 135  K 3.9   < > 3.7 3.7  CL 99  --  102 102  CO2 27  --  26 25  GLUCOSE 122*  --  115* 103*  BUN 8  --  8 8  CREATININE 0.56  --  0.68 0.52  CALCIUM 10.0  --  8.9 8.8*   < > = values in this interval not displayed.   GFR Estimated Creatinine Clearance: 41.8 mL/min (by C-G formula based on SCr of 0.52 mg/dL). Liver Function Tests: Recent Labs  Lab 10/08/19 2117  AST 25  ALT 32  ALKPHOS 73  BILITOT 0.9  PROT 7.6  ALBUMIN  4.7   Recent Labs  Lab 10/08/19 2117  LIPASE 27   No results for input(s): AMMONIA in the last 168 hours. Coagulation profile Recent Labs  Lab 10/09/19 0839  INR 1.0    CBC: Recent Labs  Lab 10/10/19 0116 10/10/19 0743 10/10/19 1314 10/10/19 1918 10/11/19 0123  WBC 6.9 6.4 6.2 6.0 6.9  HGB 13.0 12.6 13.0 11.9* 11.9*  HCT 37.9 37.1 38.2 35.1* 34.5*  MCV 91.3 91.4 91.0 90.9 91.3  PLT 157 171 157 160 154   Cardiac Enzymes: No results for input(s): CKTOTAL, CKMB, CKMBINDEX, TROPONINI in the last 168 hours. BNP: Invalid input(s): POCBNP CBG: Recent Labs  Lab 10/10/19 0737 10/11/19 0743 10/12/19 0736  GLUCAP 107* 90 100*   D-Dimer No results for input(s): DDIMER in the last 72 hours. Hgb A1c No results for input(s): HGBA1C in the last 72 hours. Lipid Profile No results for input(s): CHOL, HDL, LDLCALC, TRIG, CHOLHDL, LDLDIRECT in the last 72 hours. Thyroid function studies No results for input(s): TSH, T4TOTAL, T3FREE, THYROIDAB in the last 72 hours.  Invalid input(s): FREET3 Anemia work up No results for  input(s): VITAMINB12, FOLATE, FERRITIN, TIBC, IRON, RETICCTPCT in the last 72 hours. Microbiology Recent Results (from the past 240 hour(s))  SARS Coronavirus 2 by RT PCR (hospital order, performed in [facility] hospital lab) Nasopharyngeal Nasopharyngeal Swab     Status: None   Collection Time: 10/09/19  7:31 AM   Specimen: Nasopharyngeal Swab  Result Value Ref Range Status   SARS Coronavirus 2 NEGATIVE NEGATIVE Final    Comment: (NOTE) SARS-CoV-2 target nucleic acids are NOT DETECTED.  The SARS-CoV-2 RNA is generally detectable in upper and lower respiratory specimens during the acute phase of infection. The lowest concentration of SARS-CoV-2 viral copies this assay can detect is 250 copies / mL. A negative result does not preclude SARS-CoV-2 infection and should not be used as the sole basis for treatment or other patient management decisions.  A  negative result may occur with improper specimen collection / handling, submission of specimen other than nasopharyngeal swab, presence of viral mutation(s) within the areas targeted by this assay, and inadequate number of viral copies (<250 copies / mL). A negative result must be combined with clinical observations, patient history, and epidemiological information.  Fact Sheet for Patients:   BoilerBrush.com.cy  Fact Sheet for Healthcare Providers: https://pope.com/  This test is not yet approved or  cleared by the [place] FDA and has been authorized for detection and/or diagnosis of SARS-CoV-2 by FDA under an Emergency Use Authorization (EUA).  This EUA will remain in effect (meaning this test can be used) for the duration of the COVID-19 declaration under Section 564(b)(1) of the Act, 21 U.S.C. section 360bbb-3(b)(1), unless the authorization is terminated or revoked sooner.  Performed at [facility], [address] [place], [place] [address]      Discharge Instructions:   Discharge Instructions    Diet - low sodium heart healthy   Complete by: As directed    Discharge instructions   Complete by: As directed    Follow up with PCP with [facility] as soon as possible   Increase activity slowly   Complete by: As directed      Allergies as of 10/12/2019      Reactions   Chocolate    HA      Medication List    TAKE these medications   alum & mag hydroxide-simeth 200-200-20 MG/5ML suspension Commonly known as: MAALOX/MYLANTA Take by mouth every 6 (six) hours as needed for indigestion or heartburn.   aspirin EC 81 MG tablet Take 81 mg by mouth at bedtime. pm   Biotene Dry Mouth Moisturizing Soln Take 2 sprays by mouth 6 (six) times daily. AS NEEDED   CENTRUM SILVER 50+WOMEN PO Take by mouth.   cholecalciferol 1000 units tablet Commonly known as: VITAMIN D Take 1,000 Units by mouth daily.     diltiazem 60 MG tablet Commonly known as: CARDIZEM Take 30 mg by mouth every 4 (four) hours as needed. Call MD if you take more than 2 doses in 24 hours   ketotifen 0.025 % ophthalmic solution Commonly known as: ZADITOR Place 1 drop into both eyes 2 (two) times daily as needed for dry eyes.   meclizine 25 MG tablet Commonly known as: ANTIVERT Take 25 mg by mouth 3 (three) times daily as needed for dizziness.   melatonin 5 MG Tabs Take 2 tablets by mouth at bedtime.   metoprolol succinate 25 MG 24 hr tablet Commonly known as: TOPROL-XL Take 25 mg by mouth at bedtime.   nitroGLYCERIN 0.4 MG  SL tablet Commonly known as: NITROSTAT Place 0.4 mg under the tongue every 5 (five) minutes as needed for chest pain.   Omeprazole 20 MG Tbec Take 20 mg by mouth every morning.   polyethylene glycol 17 g packet Commonly known as: MIRALAX / GLYCOLAX Take 17 g by mouth daily.   traZODone 50 MG tablet Commonly known as: DESYREL Take 25 mg by mouth at bedtime.   vitamin E 180 MG (400 UNITS) capsule Take 400 Units by mouth daily.         Time coordinating discharge: 28 minutes  Signed:  Gurtha Picker  [facility] 10/12/2019, 10:21 AM   Pager on [email or web address]. If 7PM-7AM, please contact night-coverage at [email or web address]

## 2020-10-06 ENCOUNTER — Emergency Department
Admission: EM | Admit: 2020-10-06 | Discharge: 2020-10-06 | Disposition: A | Discharge status: Home / Self Care | Payer: Medicare (Managed Care) | Attending: Emergency Medicine | Admitting: Emergency Medicine

## 2020-10-06 ENCOUNTER — Emergency Department: Payer: Medicare (Managed Care)

## 2020-10-06 DIAGNOSIS — J449 Chronic obstructive pulmonary disease, unspecified: Secondary | ICD-10-CM | POA: Insufficient documentation

## 2020-10-06 DIAGNOSIS — Z20822 Contact with and (suspected) exposure to covid-19: Secondary | ICD-10-CM | POA: Diagnosis not present

## 2020-10-06 DIAGNOSIS — I10 Essential (primary) hypertension: Secondary | ICD-10-CM | POA: Insufficient documentation

## 2020-10-06 DIAGNOSIS — Z7982 Long term (current) use of aspirin: Secondary | ICD-10-CM | POA: Diagnosis not present

## 2020-10-06 DIAGNOSIS — I471 Supraventricular tachycardia: Secondary | ICD-10-CM | POA: Diagnosis not present

## 2020-10-06 DIAGNOSIS — R079 Chest pain, unspecified: Secondary | ICD-10-CM | POA: Diagnosis present

## 2020-10-06 LAB — CBC WITH DIFFERENTIAL/PLATELET
Abs Immature Granulocytes: 0.03 10*3/uL (ref 0.00–0.07)
Basophils Absolute: 0.1 10*3/uL (ref 0.0–0.1)
Basophils Relative: 1 %
Eosinophils Absolute: 0.5 10*3/uL (ref 0.0–0.5)
Eosinophils Relative: 7 %
HCT: 37.5 % (ref 36.0–46.0)
Hemoglobin: 12.8 g/dL (ref 12.0–15.0)
Immature Granulocytes: 0 %
Lymphocytes Relative: 30 %
Lymphs Abs: 2.1 10*3/uL (ref 0.7–4.0)
MCH: 31.3 pg (ref 26.0–34.0)
MCHC: 34.1 g/dL (ref 30.0–36.0)
MCV: 91.7 fL (ref 80.0–100.0)
Monocytes Absolute: 0.6 10*3/uL (ref 0.1–1.0)
Monocytes Relative: 9 %
Neutro Abs: 3.6 10*3/uL (ref 1.7–7.7)
Neutrophils Relative %: 53 %
Platelets: 177 10*3/uL (ref 150–400)
RBC: 4.09 MIL/uL (ref 3.87–5.11)
RDW: 12.7 % (ref 11.5–15.5)
WBC: 6.9 10*3/uL (ref 4.0–10.5)
nRBC: 0 % (ref 0.0–0.2)

## 2020-10-06 LAB — COMPREHENSIVE METABOLIC PANEL
ALT: 22 U/L (ref 0–44)
AST: 20 U/L (ref 15–41)
Albumin: 3.9 g/dL (ref 3.5–5.0)
Alkaline Phosphatase: 60 U/L (ref 38–126)
Anion gap: 6 (ref 5–15)
BUN: 14 mg/dL (ref 8–23)
CO2: 29 mmol/L (ref 22–32)
Calcium: 9.2 mg/dL (ref 8.9–10.3)
Chloride: 99 mmol/L (ref 98–111)
Creatinine, Ser: 0.96 mg/dL (ref 0.44–1.00)
GFR, Estimated: 57 mL/min — ABNORMAL LOW (ref >60)
Glucose, Bld: 122 mg/dL — ABNORMAL HIGH (ref 70–99)
Potassium: 3.8 mmol/L (ref 3.5–5.1)
Sodium: 134 mmol/L — ABNORMAL LOW (ref 135–145)
Total Bilirubin: 0.5 mg/dL (ref 0.3–1.2)
Total Protein: 6.6 g/dL (ref 6.5–8.1)

## 2020-10-06 LAB — RESP PANEL BY RT-PCR (FLU A&B, COVID) ARPGX2
Influenza A by PCR: NEGATIVE
Influenza B by PCR: NEGATIVE
SARS Coronavirus 2 by RT PCR: NEGATIVE

## 2020-10-06 LAB — TROPONIN I (HIGH SENSITIVITY)
Troponin I (High Sensitivity): 11 ng/L (ref <18)
Troponin I (High Sensitivity): 71 ng/L — ABNORMAL HIGH (ref <18)

## 2020-10-06 MED ORDER — DILTIAZEM HCL 25 MG/5ML IV SOLN
20.0000 mg | Freq: Once | INTRAVENOUS | Status: AC
  Administered 2020-10-06: 20 mg via INTRAVENOUS
  Filled 2020-10-06: qty 5

## 2020-10-06 MED ORDER — METOPROLOL SUCCINATE ER 25 MG PO TB24: 50.0000 mg | ORAL_TABLET | Freq: Every day | ORAL | 0 refills | Status: DC

## 2020-10-06 MED ORDER — METOPROLOL SUCCINATE ER 50 MG PO TB24
25.0000 mg | ORAL_TABLET | Freq: Once | ORAL | Status: AC
  Administered 2020-10-06: 25 mg via ORAL
  Filled 2020-10-06: qty 1

## 2020-10-06 NOTE — ED Notes (Signed)
While giving cardizem, pt converts to NSR at 70BPM and BP decreases to 98/57. 10mg /20mg  given. MD made aware, hold remainder of cardizem. Repeat ekg performed, given to MD [facility]

## 2020-10-06 NOTE — ED Triage Notes (Signed)
Pt BIBA for CP 6/10 pressure/squeezing around whole chest sudden onset. Pt states this has happened before more than once.

## 2020-10-06 NOTE — ED Notes (Signed)
Pt NAD, a/ox4. Pt and family member verbalize understanding of all DC and f/u instructions. All questions answered. Pt wheeled to vehicle and assisted into passenger side.

## 2020-10-06 NOTE — Discharge Instructions (Addendum)
Your heart rate was elevated today.  You have supraventricular tachycardia.  Please continue to take the diltiazem as you have.  Please increase your dose of metoprolol to 50 mg nightly which will be 2 tablets instead of 1 that you have been taking.  You need to see a cardiologist for further monitoring and management of your condition.

## 2020-10-06 NOTE — ED Provider Notes (Addendum)
[facility]  ____________________________________________   Event Date/Time   First MD Initiated Contact with Patient 10/06/20 1713     (approximate)  I have reviewed the triage vital signs and the nursing notes.   HISTORY  Chief Complaint Chest Pain    HPI Ann Welch is a 87 y.o. female with past medical history of SVT, questionable atrial fibrillation not on anticoagulation, COPD, arthritis, GERD who presents with chest pain.  Patient symptoms started around 3 PM today.  She endorses a pressure sensation in her chest as well as palpitations.  She has no associated shortness of breath, diaphoresis nausea or vomiting.  She has had similar symptoms in the past.  She takes Cardizem and metoprolol.  On review of her record it appears that she does have a history of SVT in the past for which she takes Cardizem and metoprolol.  There are notes that mention atrial fibrillation however I do not see this mentioned in her cardiology visits from 2018 and she is not on a blood thinner.  Patient is aware that she has tachycardia but has not heard of atrial fibrillation.  Patient denies any other recent illnesses.      Past Medical History:  Diagnosis Date   Anginal pain (HCC)    Anxiety    Arthritis    COPD (chronic obstructive pulmonary disease) (HCC)    Dyspnea    Dysrhythmia    Hx of A-Fib   GERD (gastroesophageal reflux disease)    Headache    1/week   History of recent hospitalization 05/05/2017   In [place] for low BP and angina with Heart Cath, no significant findings, Surgical clearance in chart [facility] (hard of hearing)    right ear   Hypertension    Liver mass    Meniere disease    Orthopnea    Restless leg syndrome    Sleep apnea    CPAP   Wears dentures    upper    Patient Active Problem List   Diagnosis Date Noted   GIB (gastrointestinal bleeding) 10/09/2019   Atrial fibrillation, chronic (HCC)  10/09/2019   COPD (chronic obstructive pulmonary disease) (HCC)    GERD (gastroesophageal reflux disease)    Hypertension    Lesion of endometrium    Chest pain 12/29/2017    Past Surgical History:  Procedure Laterality Date   BREAST BIOPSY     CARDIAC CATHETERIZATION     CATARACT EXTRACTION W/PHACO Right 07/05/2017   Procedure: CATARACT EXTRACTION PHACO AND INTRAOCULAR LENS PLACEMENT (IOC) RIGHT;  Surgeon: Lockie Mola, MD;  Location: [facility];  Service: Ophthalmology;  Laterality: Right;  Sleep Apnea-CPAP Spanish speaking-understands English   CATARACT EXTRACTION W/PHACO Left 10/06/2017   Procedure: CATARACT EXTRACTION PHACO AND INTRAOCULAR LENS PLACEMENT (IOC)  LEFT PRE DIABETIC;  Surgeon: Lockie Mola, MD;  Location: [facility];  Service: Ophthalmology;  Laterality: Left;   COLONOSCOPY     COLONOSCOPY WITH PROPOFOL N/A 10/10/2019   Procedure: COLONOSCOPY WITH PROPOFOL;  Surgeon: [name], MD;  Location: [facility];  Service: Gastroenterology;  Laterality: N/A;    Prior to Admission medications   Medication Sig Start Date End Date Taking? Authorizing Provider  metoprolol succinate (TOPROL XL) 25 MG 24 hr tablet Take 2 tablets (50 mg total) by mouth daily. 10/06/20 11/05/20 Yes Georga Hacking, MD  alum & mag hydroxide-simeth (MAALOX/MYLANTA) 200-200-20 MG/5ML suspension Take by mouth every 6 (six) hours as needed for  indigestion or heartburn.    [provider]  Artificial Saliva (BIOTENE DRY MOUTH MOISTURIZING) SOLN Take 2 sprays by mouth 6 (six) times daily. AS NEEDED 09/07/19   [provider]  aspirin EC 81 MG tablet Take 81 mg by mouth at bedtime. pm    [provider]  cholecalciferol (VITAMIN D) 1000 units tablet Take 1,000 Units by mouth daily.    [provider]  diltiazem (CARDIZEM) 60 MG tablet Take 30 mg by mouth every 4 (four) hours as needed. Call MD if you take more than 2 doses in 24 hours  06/14/19   [provider]  ketotifen (ZADITOR) 0.025 % ophthalmic solution Place 1 drop into both eyes 2 (two) times daily as needed for dry eyes. 09/27/19   [provider]  meclizine (ANTIVERT) 25 MG tablet Take 25 mg by mouth 3 (three) times daily as needed for dizziness.    [provider]  Melatonin 5 MG TABS Take 2 tablets by mouth at bedtime.     [provider]  metoprolol succinate (TOPROL-XL) 25 MG 24 hr tablet Take 25 mg by mouth at bedtime.    [provider]  Multiple Vitamins-Minerals (CENTRUM SILVER 50+WOMEN PO) Take by mouth.    [provider]  nitroGLYCERIN (NITROSTAT) 0.4 MG SL tablet Place 0.4 mg under the tongue every 5 (five) minutes as needed for chest pain.    [provider]  Omeprazole 20 MG TBEC Take 20 mg by mouth every morning. 06/09/19   [provider]  polyethylene glycol (MIRALAX / GLYCOLAX) packet Take 17 g by mouth daily.    [provider]  traZODone (DESYREL) 50 MG tablet Take 25 mg by mouth at bedtime. 09/27/19   [provider]  vitamin E 400 UNIT capsule Take 400 Units by mouth daily.    [provider]    Allergies Chocolate  Family History  Problem Relation Age of Onset   Dementia Sister    Cancer Sister        Not sure which type of cancer    Social History Social History   Tobacco Use   Smoking status: Never   Smokeless tobacco: Never  Vaping Use   Vaping Use: Never used  Substance Use Topics   Alcohol use: No   Drug use: No    Review of Systems   Review of Systems  Constitutional:  Negative for chills, fatigue and fever.  Respiratory:  Positive for chest tightness. Negative for shortness of breath.   Cardiovascular:  Positive for chest pain and palpitations. Negative for leg swelling.  Gastrointestinal:  Negative for abdominal pain, nausea and vomiting.  Genitourinary:  Negative for dysuria.  Neurological:  Negative for light-headedness.   All other systems reviewed and are negative.  Physical Exam Updated Vital Signs BP 106/66   Pulse (!) 56   Temp 98.2 F (36.8 C) (Oral)   Resp 18   Ht 5\' 4"  (1.626 m)   Wt 62.6 kg   SpO2 96%   BMI 23.69 kg/m   Physical Exam Vitals and nursing note reviewed.  Constitutional:      General: She is not in acute distress.    Appearance: Normal appearance.  HENT:     Head: Normocephalic and atraumatic.  Eyes:     General: No scleral icterus.    Conjunctiva/sclera: Conjunctivae normal.  Cardiovascular:     Rate and Rhythm: Regular rhythm. Tachycardia present.  Pulmonary:     Effort: Pulmonary  effort is normal. No respiratory distress.     Breath sounds: No stridor.  Abdominal:     Palpations: Abdomen is soft.     Tenderness: There is no guarding.  Musculoskeletal:        General: No deformity or signs of injury.     Cervical back: Normal range of motion.  Skin:    General: Skin is dry.     Coloration: Skin is not jaundiced or pale.  Neurological:     General: No focal deficit present.     Mental Status: She is alert and oriented to person, place, and time. Mental status is at baseline.  Psychiatric:        Mood and Affect: Mood normal.        Behavior: Behavior normal.     LABS (all labs ordered are listed, but only abnormal results are displayed)  Labs Reviewed  COMPREHENSIVE METABOLIC PANEL - Abnormal; Notable for the following components:      Result Value   Sodium 134 (*)    Glucose, Bld 122 (*)    GFR, Estimated 57 (*)    All other components within normal limits  TROPONIN I (HIGH SENSITIVITY) - Abnormal; Notable for the following components:   Troponin I (High Sensitivity) 71 (*)    All other components within normal limits  RESP PANEL BY RT-PCR (FLU A&B, COVID) ARPGX2  CBC WITH DIFFERENTIAL/PLATELET  TROPONIN I (HIGH SENSITIVITY)   ____________________________________________  EKG  EKG 1: Narrow complex tachycardia, left axis deviation, normal  intervals, no acute ischemic changes  EKG#2: Sinus rhythm, left axis deviation, normal intervals, no acute ischemic changes ____________________________________________  RADIOLOGY Ky Barban, personally viewed and evaluated these images (plain radiographs) as part of my medical decision making, as well as reviewing the written report by the radiologist.  ED MD interpretation: I reviewed the chest x-ray which does not show any acute cardiopulmonary process    ____________________________________________   PROCEDURES  Procedure(s) performed (including Critical Care):  .Critical Care Performed by: Georga Hacking, MD Authorized by: Georga Hacking, MD   Critical care provider statement:    Critical care time (minutes):  45   Critical care was necessary to treat or prevent imminent or life-threatening deterioration of the following conditions:  Cardiac failure   Critical care was time spent personally by me on the following activities:  Discussions with consultants, evaluation of patient's response to treatment, examination of patient, ordering and performing treatments and interventions, ordering and review of laboratory studies, ordering and review of radiographic studies, pulse oximetry, re-evaluation of patient's condition, obtaining history from patient or surrogate and review of old charts   ____________________________________________   INITIAL IMPRESSION / ASSESSMENT AND PLAN / ED COURSE     87 year old female who presents with chest pain and tachycardia.  On exam she is well-appearing.  She is tachy to the 130s, appears in no distress.  The rhythm is regular on the monitor with no visible P waves.  EKG is obtained which is read by the computer as atrial fibrillation however looks extremely regular to me.  I suspect it is in SVT.  Patient was given 10 mg of diltiazem and immediately converted to normal sinus rhythm.  After her heart rate was controlled she no  longer has symptoms. her initial troponin was 11.  On repeat 2 hours later it is 71.  Again she continues to be pain-free. Suspect this is in the setting of demand related to the 3  hours of tachycardia.  I discussed with Dr. Juliann Pares, cardiologist on-call who agrees that this is likely demand and given she is asymptomatic he would not work-up further for ischemia.  He recommended increasing her metoprolo, 25 mg to 50 mg nightly.  Keep diltiazem as is.  I discussed the results with the patient as well as the plan she was comfortable with this.  I advised that she needs to follow-up with a cardiologist.  She is otherwise stable for discharge.      ____________________________________________   FINAL CLINICAL IMPRESSION(S) / ED DIAGNOSES  Final diagnoses:  Supraventricular tachycardia [facility])     ED Discharge Orders          Ordered    metoprolol succinate (TOPROL XL) 25 MG 24 hr tablet  Daily        10/06/20 2054             Note:  This document was prepared using Dragon voice recognition software and may include unintentional dictation errors.    Georga Hacking, MD 10/06/20 2056    Georga Hacking, MD 10/06/20 2056

## 2021-01-13 ENCOUNTER — Other Ambulatory Visit: Payer: Self-pay | Admitting: Family Medicine

## 2021-01-13 DIAGNOSIS — Z0000 Encounter for general adult medical examination without abnormal findings: Secondary | ICD-10-CM

## 2021-03-26 ENCOUNTER — Other Ambulatory Visit: Payer: Medicare (Managed Care)

## 2021-05-13 ENCOUNTER — Other Ambulatory Visit: Payer: Medicare (Managed Care)

## 2021-05-19 ENCOUNTER — Ambulatory Visit
Admission: RE | Admit: 2021-05-19 | Discharge: 2021-05-19 | Disposition: A | Discharge status: Home / Self Care | Payer: Medicare (Managed Care) | Source: Ambulatory Visit | Attending: Family Medicine | Admitting: Family Medicine

## 2021-05-19 DIAGNOSIS — M810 Age-related osteoporosis without current pathological fracture: Secondary | ICD-10-CM | POA: Diagnosis not present

## 2021-05-19 DIAGNOSIS — Z780 Asymptomatic menopausal state: Secondary | ICD-10-CM | POA: Insufficient documentation

## 2021-05-19 DIAGNOSIS — Z0000 Encounter for general adult medical examination without abnormal findings: Secondary | ICD-10-CM | POA: Insufficient documentation

## 2021-05-19 DIAGNOSIS — Z13820 Encounter for screening for osteoporosis: Secondary | ICD-10-CM | POA: Insufficient documentation

## 2021-09-06 ENCOUNTER — Emergency Department: Payer: Medicare (Managed Care)

## 2021-09-06 ENCOUNTER — Encounter: Payer: Self-pay | Admitting: Intensive Care

## 2021-09-06 ENCOUNTER — Other Ambulatory Visit: Payer: Self-pay

## 2021-09-06 ENCOUNTER — Emergency Department
Admission: EM | Admit: 2021-09-06 | Discharge: 2021-09-06 | Disposition: A | Discharge status: Home / Self Care | Payer: Medicare (Managed Care) | Attending: Emergency Medicine | Admitting: Emergency Medicine

## 2021-09-06 DIAGNOSIS — I10 Essential (primary) hypertension: Secondary | ICD-10-CM | POA: Insufficient documentation

## 2021-09-06 DIAGNOSIS — J449 Chronic obstructive pulmonary disease, unspecified: Secondary | ICD-10-CM | POA: Diagnosis not present

## 2021-09-06 DIAGNOSIS — R1011 Right upper quadrant pain: Secondary | ICD-10-CM | POA: Diagnosis present

## 2021-09-06 DIAGNOSIS — R109 Unspecified abdominal pain: Secondary | ICD-10-CM

## 2021-09-06 LAB — COMPREHENSIVE METABOLIC PANEL
ALT: 29 U/L (ref 0–44)
AST: 23 U/L (ref 15–41)
Albumin: 4.2 g/dL (ref 3.5–5.0)
Alkaline Phosphatase: 62 U/L (ref 38–126)
Anion gap: 7 (ref 5–15)
BUN: 11 mg/dL (ref 8–23)
CO2: 26 mmol/L (ref 22–32)
Calcium: 9.5 mg/dL (ref 8.9–10.3)
Chloride: 100 mmol/L (ref 98–111)
Creatinine, Ser: 0.58 mg/dL (ref 0.44–1.00)
GFR, Estimated: >60 mL/min (ref >60)
Glucose, Bld: 105 mg/dL — ABNORMAL HIGH (ref 70–99)
Potassium: 4.3 mmol/L (ref 3.5–5.1)
Sodium: 133 mmol/L — ABNORMAL LOW (ref 135–145)
Total Bilirubin: 0.7 mg/dL (ref 0.3–1.2)
Total Protein: 7.2 g/dL (ref 6.5–8.1)

## 2021-09-06 LAB — URINALYSIS, ROUTINE W REFLEX MICROSCOPIC
Bacteria, UA: NONE SEEN
Bilirubin Urine: NEGATIVE
Glucose, UA: NEGATIVE mg/dL
Hgb urine dipstick: NEGATIVE
Ketones, ur: NEGATIVE mg/dL
Leukocytes,Ua: NEGATIVE
Nitrite: POSITIVE — AB
Protein, ur: NEGATIVE mg/dL
Specific Gravity, Urine: 1.021 (ref 1.005–1.030)
Squamous Epithelial / HPF: NONE SEEN (ref 0–5)
pH: 8 (ref 5.0–8.0)

## 2021-09-06 LAB — CBC
HCT: 38.6 % (ref 36.0–46.0)
Hemoglobin: 13 g/dL (ref 12.0–15.0)
MCH: 30.2 pg (ref 26.0–34.0)
MCHC: 33.7 g/dL (ref 30.0–36.0)
MCV: 89.8 fL (ref 80.0–100.0)
Platelets: 179 10*3/uL (ref 150–400)
RBC: 4.3 MIL/uL (ref 3.87–5.11)
RDW: 12.8 % (ref 11.5–15.5)
WBC: 7.1 10*3/uL (ref 4.0–10.5)
nRBC: 0 % (ref 0.0–0.2)

## 2021-09-06 LAB — LIPASE, BLOOD: Lipase: 32 U/L (ref 11–51)

## 2021-09-06 MED ORDER — ACETAMINOPHEN 325 MG PO TABS
650.0000 mg | ORAL_TABLET | Freq: Once | ORAL | Status: AC
  Administered 2021-09-06: 650 mg via ORAL
  Filled 2021-09-06: qty 2

## 2021-09-06 MED ORDER — SODIUM CHLORIDE 0.9 % IV SOLN
1.0000 g | Freq: Once | INTRAVENOUS | Status: AC
  Administered 2021-09-06: 1 g via INTRAVENOUS

## 2021-09-06 MED ORDER — ACETAMINOPHEN 500 MG PO TABS: 1000.0000 mg | ORAL_TABLET | Freq: Three times a day (TID) | ORAL | 0 refills | Status: AC | PRN

## 2021-09-06 MED ORDER — MORPHINE SULFATE (PF) 4 MG/ML IV SOLN
4.0000 mg | Freq: Once | INTRAVENOUS | Status: AC
  Administered 2021-09-06: 4 mg via INTRAVENOUS
  Filled 2021-09-06: qty 1

## 2021-09-06 MED ORDER — ACETAMINOPHEN 500 MG PO TABS: 1000.0000 mg | ORAL_TABLET | Freq: Three times a day (TID) | ORAL | 0 refills | Status: DC | PRN

## 2021-09-06 MED ORDER — IOHEXOL 300 MG/ML  SOLN
100.0000 mL | Freq: Once | INTRAMUSCULAR | Status: AC | PRN
  Administered 2021-09-06: 100 mL via INTRAVENOUS

## 2021-09-06 MED ORDER — CEFDINIR 300 MG PO CAPS: 300.0000 mg | ORAL_CAPSULE | Freq: Two times a day (BID) | ORAL | 0 refills | Status: AC

## 2021-09-06 MED ORDER — CEFDINIR 300 MG PO CAPS: 300.0000 mg | ORAL_CAPSULE | Freq: Two times a day (BID) | ORAL | 0 refills | Status: DC

## 2021-09-06 NOTE — ED Provider Notes (Signed)
Assumed care from Dr. Sidney Ace at 3 PM. Briefly, the patient is a 88 y.o. female with PMHx of  has a past medical history of Anginal pain (HCC), Anxiety, Arthritis, COPD (chronic obstructive pulmonary disease) (HCC), Dyspnea, Dysrhythmia, GERD (gastroesophageal reflux disease), Headache, History of recent hospitalization (05/05/2017), [place] (hard of hearing), Hypertension, Liver mass, Meniere disease, Orthopnea, Restless leg syndrome, Sleep apnea, and Wears dentures. here with abdominal pain, particularly RUQ. H/o similar issues in past. LFTs, Bili normal. No leukocytosis. US shows slightly enlarged CBD though still not outside of expected for age, and chronic appearing cysts. LFTs, Bili wnl. Plan to f/u CT and reassess. UA pending as well.   Labs Reviewed  COMPREHENSIVE METABOLIC PANEL - Abnormal; Notable for the following components:      Result Value   Sodium 133 (*)    Glucose, Bld 105 (*)    All other components within normal limits  LIPASE, BLOOD  CBC  URINALYSIS, ROUTINE W REFLEX MICROSCOPIC    Course of Care: CT abdomen/pelvis unremarkable.  Patient's symptoms are significantly improved.  She would like to manage symptoms as an outpatient which I think is reasonable.  Will give symptomatic control, outpatient follow-up.  Of note, urinalysis shows positive nitrite.  Given her right flank pain, will treat for possible UTI although UA otherwise unremarkable.  Encouraged outpatient follow-up with her PCP.    Shaune Pollack, MD 09/06/21 [phone]

## 2021-09-06 NOTE — ED Provider Notes (Signed)
[facility] Provider Note    Event Date/Time   First MD Initiated Contact with Patient 09/06/21 1254     (approximate)   History   Abdominal Pain   HPI  Ann Welch is a 88 y.o. female with past medical history of COPD, GERD, hypertension who presents with right upper quadrant pain.  Patient says she has had this pain on and off for several months but it was not significant.  Over the last day or so it has been more severe.  She still eating and drinking denies nausea vomiting fevers chills or constipation.  Denies urinary symptoms.  Pain is located in right upper quadrant is rather constant.    Past Medical History:  Diagnosis Date   Anginal pain (HCC)    Anxiety    Arthritis    COPD (chronic obstructive pulmonary disease) (HCC)    Dyspnea    Dysrhythmia    Hx of A-Fib   GERD (gastroesophageal reflux disease)    Headache    1/week   History of recent hospitalization 05/05/2017   In [place] for low BP and angina with Heart Cath, no significant findings, Surgical clearance in chart [facility] (hard of hearing)    right ear   Hypertension    Liver mass    Meniere disease    Orthopnea    Restless leg syndrome    Sleep apnea    CPAP   Wears dentures    upper    Patient Active Problem List   Diagnosis Date Noted   GIB (gastrointestinal bleeding) 10/09/2019   Atrial fibrillation, chronic (HCC) 10/09/2019   COPD (chronic obstructive pulmonary disease) (HCC)    GERD (gastroesophageal reflux disease)    Hypertension    Lesion of endometrium    Chest pain 12/29/2017     Physical Exam  Triage Vital Signs: ED Triage Vitals  Enc Vitals Group     BP 09/06/21 1013 (!) 152/64     Pulse Rate 09/06/21 1013 (!) 56     Resp 09/06/21 1013 16     Temp 09/06/21 1013 98.3 F (36.8 C)     Temp Source 09/06/21 1013 Oral     SpO2 09/06/21 1013 100 %     Weight 09/06/21 1015 140 lb (63.5 kg)     Height 09/06/21 1015 5'  3" (1.6 m)     Head Circumference --      Peak Flow --      Pain Score 09/06/21 1015 8     Pain Loc --      Pain Edu? --      Excl. in GC? --     Most recent vital signs: Vitals:   09/06/21 1301 09/06/21 1458  BP: (!) 158/78 (!) 160/77  Pulse: (!) 55 85  Resp: 19 20  Temp: 97.9 F (36.6 C) 97.8 F (36.6 C)  SpO2: 98% 100%     General: Awake, no distress.  CV:  Good peripheral perfusion.  Resp:  Normal effort.  Abd:  No distention.  Mild tenderness to palpation the right upper quadrant with voluntary guarding abdomen otherwise soft Neuro:             Awake, Alert, Oriented x 3  Other:     ED Results / Procedures / Treatments  Labs (all labs ordered are listed, but only abnormal results are displayed) Labs Reviewed  COMPREHENSIVE METABOLIC PANEL - Abnormal; Notable for the following  components:      Result Value   Sodium 133 (*)    Glucose, Bld 105 (*)    All other components within normal limits  LIPASE, BLOOD  CBC  URINALYSIS, ROUTINE W REFLEX MICROSCOPIC     EKG  EKG interpreted by myself shows sinus bradycardia with left axis deviation, biphasic T waves V4 through V6   RADIOLOGY Right upper quadrant ultrasound interpreted by myself shows normal gallbladder, cystic lesion in the liver   PROCEDURES:  Critical Care performed: No  Procedures  The patient is on the cardiac monitor to evaluate for evidence of arrhythmia and/or significant heart rate changes.   MEDICATIONS ORDERED IN ED: Medications  morphine (PF) 4 MG/ML injection 4 mg (4 mg Intravenous Given 09/06/21 1541)  iohexol (OMNIPAQUE) 300 MG/ML solution 100 mL (100 mLs Intravenous Contrast Given 09/06/21 1552)     IMPRESSION / MDM / ASSESSMENT AND PLAN / ED COURSE  I reviewed the triage vital signs and the nursing notes.                              Patient's presentation is most consistent with acute presentation with potential threat to life or bodily function.  Differential diagnosis  includes, but is not limited to, cholecystitis, biliary colic, pancreatitis, cholangitis, hepatic abscess, peptic ulcer, GERD  Patient is an 88 year old female presenting with right upper quadrant pain.  Had been going on for several weeks but mild and then worsening over the last day really no associated symptoms including no nausea vomiting fevers chills or chest pain.  She is mildly hypertensive but vital signs are otherwise within normal limits.  Labs are also reassuring normal LFTs normal lipase.  On exam overall she looks well tenderness is primarily in the right upper quadrant.  Exam otherwise is benign.  Right upper quadrant ultrasound obtained which is negative for cholecystitis.  There are cystic lesions in the liver which are similar to prior.  CBD upper limit of normal for age.  No lab findings to suggest biliary obstruction.  Will obtain CT abdomen pelvis with contrast to rule out other significant abdominal pathology as cause of her right upper quadrant pain.  I have signed out to oncoming provider disposition pending CT.       FINAL CLINICAL IMPRESSION(S) / ED DIAGNOSES   Final diagnoses:  RUQ pain     Rx / DC Orders   ED Discharge Orders     None        Note:  This document was prepared using Dragon voice recognition software and may include unintentional dictation errors.   Georga Hacking, MD 09/06/21 [phone]

## 2021-09-06 NOTE — ED Triage Notes (Signed)
Pt in via EMS from home. EMS reports pt c/o pain to right side. Pt still with gallbladder and appendix. Pt reports pain right flank mid abd. Denied worsening or getting better with walking. Denies changes in bowels, reports urine is dark  150/90, 96% Ra, CBG 107.

## 2021-09-06 NOTE — Discharge Instructions (Signed)
Your labs today showed a possible urinary infection, for which we will start an antibiotic  Take Tylenol 1000 mg every 6-8 hours for pain.  Continue the Mylanta/maalox twice a day for the next several days while taking the antibiotic.  Follow-up with a GI specialist in 1-2 weeks.

## 2021-09-09 LAB — URINE CULTURE: Culture: >=100000 — AB

## 2022-01-05 ENCOUNTER — Other Ambulatory Visit: Payer: Self-pay | Admitting: Family Medicine

## 2022-01-05 DIAGNOSIS — S92355S Nondisplaced fracture of fifth metatarsal bone, left foot, sequela: Secondary | ICD-10-CM

## 2022-01-08 ENCOUNTER — Ambulatory Visit
Admission: RE | Admit: 2022-01-08 | Discharge: 2022-01-08 | Disposition: A | Discharge status: Home / Self Care | Payer: Medicare (Managed Care) | Source: Ambulatory Visit | Attending: Family Medicine | Admitting: Family Medicine

## 2022-01-08 DIAGNOSIS — S92355S Nondisplaced fracture of fifth metatarsal bone, left foot, sequela: Secondary | ICD-10-CM | POA: Diagnosis present

## 2023-03-24 ENCOUNTER — Emergency Department: Payer: Medicare (Managed Care)

## 2023-03-24 ENCOUNTER — Inpatient Hospital Stay
Admission: EM | Admit: 2023-03-24 | Discharge: 2023-03-28 | DRG: 281 | Disposition: A | Discharge status: Home Health | Payer: Medicare (Managed Care) | Attending: Student | Admitting: Student

## 2023-03-24 ENCOUNTER — Other Ambulatory Visit: Payer: Self-pay

## 2023-03-24 ENCOUNTER — Inpatient Hospital Stay
Admit: 2023-03-24 | Discharge: 2023-03-24 | Disposition: A | Discharge status: Home / Self Care | Payer: Medicare (Managed Care) | Attending: Student

## 2023-03-24 DIAGNOSIS — T463X5A Adverse effect of coronary vasodilators, initial encounter: Secondary | ICD-10-CM | POA: Diagnosis present

## 2023-03-24 DIAGNOSIS — R109 Unspecified abdominal pain: Secondary | ICD-10-CM | POA: Diagnosis not present

## 2023-03-24 DIAGNOSIS — I952 Hypotension due to drugs: Secondary | ICD-10-CM | POA: Diagnosis present

## 2023-03-24 DIAGNOSIS — Z79899 Other long term (current) drug therapy: Secondary | ICD-10-CM

## 2023-03-24 DIAGNOSIS — R42 Dizziness and giddiness: Secondary | ICD-10-CM | POA: Diagnosis present

## 2023-03-24 DIAGNOSIS — J449 Chronic obstructive pulmonary disease, unspecified: Secondary | ICD-10-CM | POA: Diagnosis present

## 2023-03-24 DIAGNOSIS — W19XXXA Unspecified fall, initial encounter: Secondary | ICD-10-CM | POA: Diagnosis present

## 2023-03-24 DIAGNOSIS — I214 Non-ST elevation (NSTEMI) myocardial infarction: Secondary | ICD-10-CM | POA: Diagnosis not present

## 2023-03-24 DIAGNOSIS — I480 Paroxysmal atrial fibrillation: Secondary | ICD-10-CM | POA: Diagnosis present

## 2023-03-24 DIAGNOSIS — F419 Anxiety disorder, unspecified: Secondary | ICD-10-CM | POA: Diagnosis present

## 2023-03-24 DIAGNOSIS — I482 Chronic atrial fibrillation, unspecified: Secondary | ICD-10-CM | POA: Diagnosis present

## 2023-03-24 DIAGNOSIS — I4719 Other supraventricular tachycardia: Secondary | ICD-10-CM | POA: Diagnosis present

## 2023-03-24 DIAGNOSIS — G2581 Restless legs syndrome: Secondary | ICD-10-CM | POA: Diagnosis present

## 2023-03-24 DIAGNOSIS — Z7982 Long term (current) use of aspirin: Secondary | ICD-10-CM | POA: Diagnosis not present

## 2023-03-24 DIAGNOSIS — I10 Essential (primary) hypertension: Secondary | ICD-10-CM | POA: Diagnosis present

## 2023-03-24 DIAGNOSIS — S8002XA Contusion of left knee, initial encounter: Secondary | ICD-10-CM | POA: Diagnosis present

## 2023-03-24 DIAGNOSIS — K219 Gastro-esophageal reflux disease without esophagitis: Secondary | ICD-10-CM | POA: Diagnosis present

## 2023-03-24 DIAGNOSIS — I21A1 Myocardial infarction type 2: Secondary | ICD-10-CM | POA: Diagnosis present

## 2023-03-24 DIAGNOSIS — Z818 Family history of other mental and behavioral disorders: Secondary | ICD-10-CM | POA: Diagnosis not present

## 2023-03-24 DIAGNOSIS — G4733 Obstructive sleep apnea (adult) (pediatric): Secondary | ICD-10-CM | POA: Diagnosis present

## 2023-03-24 DIAGNOSIS — E871 Hypo-osmolality and hyponatremia: Secondary | ICD-10-CM | POA: Diagnosis present

## 2023-03-24 DIAGNOSIS — R110 Nausea: Secondary | ICD-10-CM | POA: Diagnosis not present

## 2023-03-24 DIAGNOSIS — Z66 Do not resuscitate: Secondary | ICD-10-CM | POA: Diagnosis present

## 2023-03-24 LAB — CBC
HCT: 38.4 % (ref 36.0–46.0)
Hemoglobin: 12.7 g/dL (ref 12.0–15.0)
MCH: 30.8 pg (ref 26.0–34.0)
MCHC: 33.1 g/dL (ref 30.0–36.0)
MCV: 93 fL (ref 80.0–100.0)
Platelets: 170 10*3/uL (ref 150–400)
RBC: 4.13 MIL/uL (ref 3.87–5.11)
RDW: 12.9 % (ref 11.5–15.5)
WBC: 9.2 10*3/uL (ref 4.0–10.5)
nRBC: 0 % (ref 0.0–0.2)

## 2023-03-24 LAB — PROTIME-INR
INR: 1 (ref 0.8–1.2)
Prothrombin Time: 13.4 s (ref 11.4–15.2)

## 2023-03-24 LAB — BASIC METABOLIC PANEL
Anion gap: 10 (ref 5–15)
BUN: 16 mg/dL (ref 8–23)
CO2: 23 mmol/L (ref 22–32)
Calcium: 9.4 mg/dL (ref 8.9–10.3)
Chloride: 97 mmol/L — ABNORMAL LOW (ref 98–111)
Creatinine, Ser: 0.68 mg/dL (ref 0.44–1.00)
GFR, Estimated: >60 mL/min (ref >60)
Glucose, Bld: 120 mg/dL — ABNORMAL HIGH (ref 70–99)
Potassium: 4.1 mmol/L (ref 3.5–5.1)
Sodium: 130 mmol/L — ABNORMAL LOW (ref 135–145)

## 2023-03-24 LAB — TROPONIN I (HIGH SENSITIVITY)
Troponin I (High Sensitivity): 883 ng/L (ref <18)
Troponin I (High Sensitivity): 1073 ng/L (ref <18)
Troponin I (High Sensitivity): 1137 ng/L (ref <18)
Troponin I (High Sensitivity): 929 ng/L (ref <18)

## 2023-03-24 LAB — OSMOLALITY: Osmolality: 275 mosm/kg (ref 275–295)

## 2023-03-24 LAB — PHOSPHORUS: Phosphorus: 3.7 mg/dL (ref 2.5–4.6)

## 2023-03-24 LAB — TSH: TSH: 0.662 u[IU]/mL (ref 0.350–4.500)

## 2023-03-24 LAB — MAGNESIUM: Magnesium: 2.3 mg/dL (ref 1.7–2.4)

## 2023-03-24 LAB — T4, FREE: Free T4: 0.69 ng/dL (ref 0.61–1.12)

## 2023-03-24 LAB — APTT: aPTT: 34 s (ref 24–36)

## 2023-03-24 MED ORDER — TRAZODONE HCL 50 MG PO TABS
25.0000 mg | ORAL_TABLET | Freq: Every day | ORAL | Status: DC
  Administered 2023-03-24 – 2023-03-27 (×4): 25 mg via ORAL
  Filled 2023-03-24 (×5): qty 1

## 2023-03-24 MED ORDER — SODIUM CHLORIDE 1 G PO TABS
1.0000 g | ORAL_TABLET | Freq: Three times a day (TID) | ORAL | Status: AC
Start: 2023-03-24 — End: 2023-03-27
  Administered 2023-03-24 – 2023-03-27 (×9): 1 g via ORAL
  Filled 2023-03-24 (×9): qty 1

## 2023-03-24 MED ORDER — ACETAMINOPHEN 650 MG RE SUPP: 650.0000 mg | Freq: Four times a day (QID) | RECTAL | Status: DC | PRN

## 2023-03-24 MED ORDER — METOPROLOL SUCCINATE ER 50 MG PO TB24: 25.0000 mg | ORAL_TABLET | Freq: Every day | ORAL | Status: DC

## 2023-03-24 MED ORDER — NITROGLYCERIN 0.4 MG SL SUBL
0.4000 mg | SUBLINGUAL_TABLET | SUBLINGUAL | Status: DC | PRN
  Administered 2023-03-25: 0.4 mg via SUBLINGUAL
  Filled 2023-03-24: qty 1

## 2023-03-24 MED ORDER — PANTOPRAZOLE SODIUM 40 MG PO TBEC
40.0000 mg | DELAYED_RELEASE_TABLET | Freq: Every day | ORAL | Status: DC
  Administered 2023-03-24 – 2023-03-28 (×5): 40 mg via ORAL
  Filled 2023-03-24 (×5): qty 1

## 2023-03-24 MED ORDER — HEPARIN (PORCINE) 25000 UT/250ML-% IV SOLN
750.0000 [IU]/h | INTRAVENOUS | Status: DC
  Administered 2023-03-24: 750 [IU]/h via INTRAVENOUS
  Filled 2023-03-24: qty 250

## 2023-03-24 MED ORDER — SODIUM CHLORIDE 0.9% FLUSH: 3.0000 mL | INTRAVENOUS | Status: DC | PRN

## 2023-03-24 MED ORDER — HEPARIN BOLUS VIA INFUSION
3800.0000 [IU] | Freq: Once | INTRAVENOUS | Status: AC
  Administered 2023-03-24: 3800 [IU] via INTRAVENOUS
  Filled 2023-03-24: qty 3800

## 2023-03-24 MED ORDER — ACETAMINOPHEN 325 MG PO TABS
650.0000 mg | ORAL_TABLET | Freq: Four times a day (QID) | ORAL | Status: DC | PRN
  Administered 2023-03-24 – 2023-03-28 (×7): 650 mg via ORAL
  Filled 2023-03-24 (×7): qty 2

## 2023-03-24 MED ORDER — SODIUM CHLORIDE 0.9% FLUSH
3.0000 mL | Freq: Two times a day (BID) | INTRAVENOUS | Status: DC
  Administered 2023-03-24 – 2023-03-25 (×4): 3 mL via INTRAVENOUS

## 2023-03-24 MED ORDER — ASPIRIN 81 MG PO TBEC
81.0000 mg | DELAYED_RELEASE_TABLET | Freq: Every day | ORAL | Status: DC
  Administered 2023-03-25: 81 mg via ORAL
  Filled 2023-03-24: qty 1

## 2023-03-24 MED ORDER — MORPHINE SULFATE (PF) 2 MG/ML IV SOLN
2.0000 mg | INTRAVENOUS | Status: DC | PRN
  Administered 2023-03-24 – 2023-03-25 (×4): 2 mg via INTRAVENOUS
  Filled 2023-03-24 (×4): qty 1

## 2023-03-24 MED ORDER — SODIUM CHLORIDE 0.9 % IV SOLN: 250.0000 mL | INTRAVENOUS | Status: AC | PRN

## 2023-03-24 MED ORDER — POLYETHYLENE GLYCOL 3350 17 G PO PACK: 17.0000 g | PACK | Freq: Every day | ORAL | Status: DC | PRN

## 2023-03-24 MED ORDER — ONDANSETRON HCL 4 MG PO TABS: 4.0000 mg | ORAL_TABLET | Freq: Four times a day (QID) | ORAL | Status: DC | PRN

## 2023-03-24 MED ORDER — ONDANSETRON HCL 4 MG/2ML IJ SOLN
4.0000 mg | Freq: Four times a day (QID) | INTRAMUSCULAR | Status: DC | PRN
  Administered 2023-03-24 – 2023-03-25 (×2): 4 mg via INTRAVENOUS
  Filled 2023-03-24 (×3): qty 2

## 2023-03-24 MED ORDER — DILTIAZEM HCL 25 MG/5ML IV SOLN
10.0000 mg | Freq: Four times a day (QID) | INTRAVENOUS | Status: DC | PRN
  Administered 2023-03-26 – 2023-03-28 (×3): 10 mg via INTRAVENOUS
  Filled 2023-03-24 (×4): qty 5

## 2023-03-24 NOTE — ED Provider Notes (Signed)
 [facility] Emergency Department Provider Note     Event Date/Time   First MD Initiated Contact with Patient 03/24/23 1303     (approximate)   History   Fall   HPI  Spanish interpreter Claretha Cooper) present for interview and exam.  Ann Welch is a 89 y.o. female with a history of chest pain, COPD, GERD, HTN, and chronic A-fib (no anticoags) presents to the ED via EMS from her facility.  Patient who is not on anticoagulants, presents after she reported some chest pain and dizziness this morning at her facility.  She woke this morning, and noted some chest pain as well as some elevated heart rates on her wrist monitor, rather than 160s.  She took her diltiazem dose this morning, but heart rate continued to be elevated.  She also took 81 mg aspirin prior to calling EMS.  She allegedly had a fall related to her symptoms, but refused transport by EMS initially.  By patient report, EMS gave her 4 baby aspirin's prior to transport.  The triage nurse make contact with the facility provider, who endorsed the initial complaints of chest pain.  The patient did not however, mention chest pain as part of her presenting symptoms.  He advised that the chest pain workup should be added the patient was agreeable.  Her primary complaint is pain to the left knee.  She now endorses chest pressure that is somewhat improved from initial onset.   Physical Exam   Triage Vital Signs: ED Triage Vitals [03/24/23 1052]  Encounter Vitals Group     BP 109/65     Systolic BP Percentile      Diastolic BP Percentile      Pulse Rate (!) 58     Resp 19     Temp 98.1 F (36.7 C)     Temp Source Oral     SpO2 96 %     Weight 142 lb (64.4 kg)     Height 5\' 3"  (1.6 m)     Head Circumference      Peak Flow      Pain Score 4     Pain Loc      Pain Education      Exclude from Growth Chart     Most recent vital signs: Vitals:   03/24/23 1322 03/24/23 1400  BP:  117/62  Pulse: 63 62   Resp: (!) 26 (!) 22  Temp:    SpO2: 94% 97%    General Awake, no distress. NAD HEENT NCAT. PERRL. EOMI. No rhinorrhea. Mucous membranes are moist.  CV:  Good peripheral perfusion. RRR RESP:  Normal effort. CTA ABD:  No distention.  MSK:  Left knee with large joint effusion noted.  Early ecchymosis is appreciated.  Normal active range of motion on the knee.   ED Results / Procedures / Treatments   Labs (all labs ordered are listed, but only abnormal results are displayed) Labs Reviewed  BASIC METABOLIC PANEL - Abnormal; Notable for the following components:      Result Value   Sodium 130 (*)    Chloride 97 (*)    Glucose, Bld 120 (*)    All other components within normal limits  TROPONIN I (HIGH SENSITIVITY) - Abnormal; Notable for the following components:   Troponin I (High Sensitivity) 883 (*)    All other components within normal limits  CBC  APTT  PROTIME-INR  OSMOLALITY  HEPARIN LEVEL (UNFRACTIONATED)  MAGNESIUM  PHOSPHORUS  TSH  TROPONIN I (HIGH SENSITIVITY)     EKG  Vent. rate 66 BPM PR interval 160 ms QRS duration 86 ms QT/QTcB 412/431 ms P-R-T axes 63 -47 52 Normal sinus rhythm Left axis deviation T wave abnormality, consider anterior ischemia Abnormal ECG When compared with ECG of 06-Sep-2021 10:17, Inverted T waves have replaced nonspecific T wave abnormality in Anterior leads  RADIOLOGY  I personally viewed and evaluated these images as part of my medical decision making, as well as reviewing the written report by the radiologist.  ED Provider Interpretation: No acute findings of the left knee  DG Chest 2 View Result Date: 03/24/2023 CLINICAL DATA:  Chest pain after fall. EXAM: CHEST - 2 VIEW COMPARISON:  Chest radiograph dated 10/06/2020. FINDINGS: The heart size and mediastinal contours are within normal limits. No focal consolidation, pleural effusion, or pneumothorax. Diffuse osseous demineralization. Moderate compression deformities of  the T12 and L1 vertebral bodies are again noted. Age-indeterminate superior endplate compression deformity of the T8 vertebral body. No obvious displaced rib fracture. IMPRESSION: 1. Mild right basilar atelectasis. Otherwise, no acute cardiopulmonary findings. 2. Age-indeterminate superior endplate compression deformity of the T8 vertebral body. 3. Redemonstrated moderate compression deformities of the T12 and L1 vertebral bodies. Electronically Signed   By: Hart Robinsons M.D.   On: 03/24/2023 14:40   DG Knee Complete 4 Views Left Result Date: 03/24/2023 CLINICAL DATA:  Pain after fall.  Swelling EXAM: LEFT KNEE - COMPLETE 4 VIEW COMPARISON:  None Available. FINDINGS: Osteopenia. Moderate joint space loss of the medial compartment with some small osteophytes. Preserved lateral compartment and patellofemoral joint. No fracture or dislocation. No joint effusion on lateral view. IMPRESSION: Degenerative changes of the medial compartment.  Osteopenia Electronically Signed   By: Karen Kays M.D.   On: 03/24/2023 12:24     PROCEDURES:  Critical Care performed: Yes, see critical care procedure note(s)  Procedures  CRITICAL CARE Performed by: Lissa Hoard   Total critical care time: 3.0 minutes  Critical care time was exclusive of separately billable procedures and treating other patients.  Critical care was necessary to treat or prevent imminent or life-threatening deterioration.  Critical care was time spent personally by me on the following activities: development of treatment plan with patient and/or surrogate as well as nursing, discussions with consultants, evaluation of patient's response to treatment, examination of patient, obtaining history from patient or surrogate, ordering and performing treatments and interventions, ordering and review of laboratory studies, ordering and review of radiographic studies, pulse oximetry and re-evaluation of patient's  condition.   MEDICATIONS ORDERED IN ED: Medications  heparin ADULT infusion 100 units/mL (25000 units/245mL) (750 Units/hr Intravenous New Bag/Given 03/24/23 1456)  sodium chloride flush (NS) 0.9 % injection 3 mL (3 mLs Intravenous Given 03/24/23 1506)  sodium chloride flush (NS) 0.9 % injection 3 mL (has no administration in time range)  0.9 %  sodium chloride infusion (has no administration in time range)  acetaminophen (TYLENOL) tablet 650 mg (650 mg Oral Given 03/24/23 1457)    Or  acetaminophen (TYLENOL) suppository 650 mg ( Rectal See Alternative 03/24/23 1457)  morphine (PF) 2 MG/ML injection 2 mg (has no administration in time range)  polyethylene glycol (MIRALAX / GLYCOLAX) packet 17 g (has no administration in time range)  ondansetron (ZOFRAN) tablet 4 mg (has no administration in time range)    Or  ondansetron (ZOFRAN) injection 4 mg (has no administration in time range)  aspirin EC tablet 81 mg (has no administration  in time range)  heparin bolus via infusion 3,800 Units (3,800 Units Intravenous Bolus from Bag 03/24/23 1456)     IMPRESSION / MDM / ASSESSMENT AND PLAN / ED COURSE  I reviewed the triage vital signs and the nursing notes.                              Differential diagnosis includes, but is not limited to, ACS, aortic dissection, pulmonary embolism, cardiac tamponade, pneumothorax, pneumonia, pericarditis, myocarditis, GI-related causes including esophagitis/gastritis, and musculoskeletal chest wall pain.    Patient's presentation is most consistent with acute complicated illness / injury requiring diagnostic workup.  The patient is on the cardiac monitor to evaluate for evidence of arrhythmia and/or significant heart rate changes.  Patient's diagnosis is consistent with NSTEMI.  Geriatric patient presents to the ED with complaints of a fall preceded by tachycardia and dizziness.  Patient with a history of A-fib not on anticoagulation therapy, presents after she  slipped outside of her bed due to acute chest pain, dizziness, and elevated heart rate.  She injured her left knee, presents to the ED via EMS from her facility.  Chest pain protocol was initiated after facility provider called the triage nurse to inform staff that the patient had an apparent cardiac event.  Patient was found to have an elevated initial troponin of 883.  Her EKG shows some T wave inversions.  Remaining labs shows a hyponatremia at 130, but no other significant abnormalities.  X-ray of the left knee reviewed by me, does not reveal any acute fracture or dislocation.  Patient at this time is stable endorsing only some mild chest pressure.  She is understandable and agreeable to the plan for admission due to her elevated troponin and NSTEMI.   Patient will be admitted to the hospital service by Dr. Lucianne Muss for ongoing evaluation and management.   FINAL CLINICAL IMPRESSION(S) / ED DIAGNOSES   Final diagnoses:  NSTEMI (non-ST elevated myocardial infarction) (HCC)  Hyponatremia  Contusion of left knee, initial encounter     Rx / DC Orders   ED Discharge Orders     None        Note:  This document was prepared using Dragon voice recognition software and may include unintentional dictation errors.    Lissa Hoard, PA-C 03/24/23 1521    Janith Lima, MD 03/25/23 [phone]

## 2023-03-24 NOTE — ED Notes (Signed)
 CCMD called and Pt placed on Cardiac Monitoring.

## 2023-03-24 NOTE — ED Notes (Signed)
 Ambulated patient to toilet with assistance of patients aide.

## 2023-03-24 NOTE — ED Notes (Signed)
 Request socks off socks removed per pt request

## 2023-03-24 NOTE — ED Notes (Signed)
 Request robe off robe removed per pt request

## 2023-03-24 NOTE — ED Notes (Signed)
 Pt request bs commode pt assisted to bs commode and back to stretcher pt remains on cardiac monitor as has been on cardiac monitoring since 1900

## 2023-03-24 NOTE — ED Notes (Signed)
 Rainbow sent to lab with Pt labels

## 2023-03-24 NOTE — Consult Note (Signed)
 Pharmacy Consult Note - Anticoagulation  Pharmacy Consult for heparin Indication: chest pain/ACS  PATIENT MEASUREMENTS: Height: 5\' 3"  (160 cm) Weight: 64.4 kg (142 lb) IBW/kg (Calculated) : 52.4 HEPARIN DW (KG): 64.4  VITAL SIGNS: Temp: 98.1 F (36.7 C) (02/26 1052) Temp Source: Oral (02/26 1052) BP: 117/62 (02/26 1400) Pulse Rate: 62 (02/26 1400)  Recent Labs    03/24/23 1251  HGB 12.7  HCT 38.4  PLT 170  CREATININE 0.68  TROPONINIHS 883*    Estimated Creatinine Clearance: 42.2 mL/min (by C-G formula based on SCr of 0.68 mg/dL).  PAST MEDICAL HISTORY: Past Medical History:  Diagnosis Date   Anginal pain (HCC)    Anxiety    Arthritis    COPD (chronic obstructive pulmonary disease) (HCC)    Dyspnea    Dysrhythmia    Hx of A-Fib   GERD (gastroesophageal reflux disease)    Headache    1/week   History of recent hospitalization 05/05/2017   In [place] for low BP and angina with Heart Cath, no significant findings, Surgical clearance in chart [facility] (hard of hearing)    right ear   Hypertension    Liver mass    Meniere disease    Orthopnea    Restless leg syndrome    Sleep apnea    CPAP   Wears dentures    upper    ASSESSMENT: 89 y.o. female with PMH including Afib (not on anticoagulation), HTN, GIB is presenting with knee pain after a fall secondary to chest pain and dizziness this morning. cTn elevated. Patient is not on chronic anticoagulation per chart review. Knee imaging negative for trauma and Hgb and PLT are stable. Pharmacy has been consulted to initiate and manage heparin intravenous infusion.  Pertinent medications: No chronic anticoagulation prior to admission per chart review Aspirin 81 mg daily  Goal(s) of therapy: Heparin level 0.3 - 0.7 units/mL Monitor platelets by anticoagulation protocol: Yes   Baseline anticoagulation labs: Recent Labs    03/24/23 1251  HGB 12.7  PLT 170     Date Time aPTT/HL Rate/Comment      PLAN: Give 3800 units bolus x1; then start heparin infusion at 750 units/hour. Check heparin level in 8 hours, then daily once at least two levels are consecutively therapeutic. Monitor CBC daily while on heparin infusion.   Will M. Dareen Piano, PharmD Clinical Pharmacist 03/24/2023 2:34 PM

## 2023-03-24 NOTE — ED Notes (Signed)
 Ambulated patient to wheelchair then to toilet and back to wheelchair to ambulate into bed.

## 2023-03-24 NOTE — Consult Note (Signed)
 [facility] CARDIOLOGY CONSULT NOTE       Patient ID: Ann Welch MRN: [id number] DOB/AGE: Jun 13, 1931 89 y.o.  Admit date: 03/24/2023 Referring Physician Dr. Gillis [facility]  Primary Cardiologist Dr. Lady Gary (last seen 2018) Reason for Consultation chest pain, elevated troponin  HPI: Ann Welch is a 89 y.o. female  with a past medical history of paroxysmal SVT, hypertension, COPD, GERD who presented to the ED on 03/24/2023 for fall.  She reportedly had also told her PCP earlier today that she had chest pain, troponin was checked and was found to be elevated.  Cardiology was consulted for further evaluation.   Patient reports that this morning she went into her kitchen to make a cup of coffee, while in the kitchen she had onset of palpitation and heart racing symptoms.  States that she has a history of this and she went to her bedroom to lay down.  Once in the bedroom she started feeling dizzy and when she sat down on her bed she fell forward onto her knees.  Did not pass out but had resultant knee pain and bruising.  She also had associated chest pain with the palpitation symptoms.  Given her symptoms she decided to come to the ED for further evaluation.  Workup in the ED notable for creatinine 0.68, potassium 4.1, sodium 130, chloride 97, hemoglobin 12.7, WBC 9.2.  Initial troponin elevated at 883.  EKG reveals normal sinus rhythm with no acute ST-T changes.  Chest x-ray without any acute cardiopulmonary abnormality.  She was started on IV heparin in the ED.  At the time of my evaluation this afternoon she is resting in ED stretcher with her son present at bedside.  We discussed her history of SVT.  States that she has episodes a few times a month and typically these resolve with as needed diltiazem.  The episode today was longer lasting and she states that her palpitation symptoms continued after roughly 2 hours.  She explains  that she had onset of chest pain associated with palpitation symptoms.  States that this is similar to prior episodes but was more significant and lasted longer.  We also discussed her dizziness and fall.  She reports that overall she is feeling better now.  Review of systems complete and found to be negative unless listed above    Past Medical History:  Diagnosis Date   Anginal pain (HCC)    Anxiety    Arthritis    COPD (chronic obstructive pulmonary disease) (HCC)    Dyspnea    Dysrhythmia    Hx of A-Fib   GERD (gastroesophageal reflux disease)    Headache    1/week   History of recent hospitalization 05/05/2017   In [place] for low BP and angina with Heart Cath, no significant findings, Surgical clearance in chart [facility] (hard of hearing)    right ear   Hypertension    Liver mass    Meniere disease    Orthopnea    Restless leg syndrome    Sleep apnea    CPAP   Wears dentures    upper    Past Surgical History:  Procedure Laterality Date   BREAST BIOPSY     CARDIAC CATHETERIZATION     CATARACT EXTRACTION W/PHACO Right 07/05/2017   Procedure: CATARACT EXTRACTION PHACO AND INTRAOCULAR LENS PLACEMENT (IOC) RIGHT;  Surgeon: Lockie Mola, MD;  Location: [facility];  Service: Ophthalmology;  Laterality: Right;  Sleep Apnea-CPAP Spanish speaking-understands English   CATARACT EXTRACTION W/PHACO Left 10/06/2017   Procedure: CATARACT EXTRACTION PHACO AND INTRAOCULAR LENS PLACEMENT (IOC)  LEFT PRE DIABETIC;  Surgeon: Lockie Mola, MD;  Location: [facility];  Service: Ophthalmology;  Laterality: Left;   COLONOSCOPY     COLONOSCOPY WITH PROPOFOL N/A 10/10/2019   Procedure: COLONOSCOPY WITH PROPOFOL;  Surgeon: [name], MD;  Location: [facility] ENDOSCOPY;  Service: Gastroenterology;  Laterality: N/A;    (Not in a hospital admission)  Social History   Socioeconomic History   Marital status: Married    Spouse name: Not on  file   Number of children: Not on file   Years of education: Not on file   Highest education level: Not on file  Occupational History   Not on file  Tobacco Use   Smoking status: Never   Smokeless tobacco: Never  Vaping Use   Vaping status: Never Used  Substance and Sexual Activity   Alcohol use: No   Drug use: No   Sexual activity: Not Currently  Other Topics Concern   Not on file  Social History Narrative   Not on file   Social Drivers of Health   Financial Resource Strain: Not on file  Food Insecurity: Not on file  Transportation Needs: Not on file  Physical Activity: Not on file  Stress: Not on file  Social Connections: Not on file  Intimate Partner Violence: Not on file    Family History  Problem Relation Age of Onset   Dementia Sister    Cancer Sister        Not sure which type of cancer     Vitals:   03/24/23 1052 03/24/23 1322 03/24/23 1400  BP: 109/65  117/62  Pulse: (!) 58 63 62  Resp: 19 (!) 26 (!) 22  Temp: 98.1 F (36.7 C)    TempSrc: Oral    SpO2: 96% 94% 97%  Weight: 64.4 kg    Height: 5\' 3"  (1.6 m)      PHYSICAL EXAM General: Well-appearing elderly female, well nourished, in no acute distress. HEENT: Normocephalic and atraumatic. Neck: No JVD.  Lungs: Normal respiratory effort on room air. Clear bilaterally to auscultation. No wheezes, crackles, rhonchi.  Heart: HRRR. Normal S1 and S2 without gallops or murmurs.  Abdomen: Non-distended appearing.  Msk: Normal strength and tone for age. Extremities: Warm and well perfused. No clubbing, cyanosis.  No edema.  L knee with ecchymosis. Neuro: Alert and oriented X 3. Psych: Answers questions appropriately.   Labs: Basic Metabolic Panel: Recent Labs    03/24/23 1251  NA 130*  K 4.1  CL 97*  CO2 23  GLUCOSE 120*  BUN 16  CREATININE 0.68  CALCIUM 9.4   Liver Function Tests: No results for input(s): "AST", "ALT", "ALKPHOS", "BILITOT", "PROT", "ALBUMIN" in the last 72 hours. No results  for input(s): "LIPASE", "AMYLASE" in the last 72 hours. CBC: Recent Labs    03/24/23 1251  WBC 9.2  HGB 12.7  HCT 38.4  MCV 93.0  PLT 170   Cardiac Enzymes: Recent Labs    03/24/23 1251  TROPONINIHS 883*   BNP: No results for input(s): "BNP" in the last 72 hours. D-Dimer: No results for input(s): "DDIMER" in the last 72 hours. Hemoglobin A1C: No results for input(s): "HGBA1C" in the last 72 hours. Fasting Lipid Panel: No results for input(s): "CHOL", "HDL", "LDLCALC", "TRIG", "CHOLHDL", "LDLDIRECT" in the last 72 hours. Thyroid Function Tests:  No results for input(s): "TSH", "T4TOTAL", "T3FREE", "THYROIDAB" in the last 72 hours.  Invalid input(s): "FREET3" Anemia Panel: No results for input(s): "VITAMINB12", "FOLATE", "FERRITIN", "TIBC", "IRON", "RETICCTPCT" in the last 72 hours.   Radiology: DG Chest 2 View Result Date: 03/24/2023 CLINICAL DATA:  Chest pain after fall. EXAM: CHEST - 2 VIEW COMPARISON:  Chest radiograph dated 10/06/2020. FINDINGS: The heart size and mediastinal contours are within normal limits. No focal consolidation, pleural effusion, or pneumothorax. Diffuse osseous demineralization. Moderate compression deformities of the T12 and L1 vertebral bodies are again noted. Age-indeterminate superior endplate compression deformity of the T8 vertebral body. No obvious displaced rib fracture. IMPRESSION: 1. Mild right basilar atelectasis. Otherwise, no acute cardiopulmonary findings. 2. Age-indeterminate superior endplate compression deformity of the T8 vertebral body. 3. Redemonstrated moderate compression deformities of the T12 and L1 vertebral bodies. Electronically Signed   By: Hart Robinsons M.D.   On: 03/24/2023 14:40   DG Knee Complete 4 Views Left Result Date: 03/24/2023 CLINICAL DATA:  Pain after fall.  Swelling EXAM: LEFT KNEE - COMPLETE 4 VIEW COMPARISON:  None Available. FINDINGS: Osteopenia. Moderate joint space loss of the medial compartment with some  small osteophytes. Preserved lateral compartment and patellofemoral joint. No fracture or dislocation. No joint effusion on lateral view. IMPRESSION: Degenerative changes of the medial compartment.  Osteopenia Electronically Signed   By: Karen Kays M.D.   On: 03/24/2023 12:24    ECHO ordered  TELEMETRY reviewed by me 03/24/2023: Sinus rhythm rate 50-60s  EKG reviewed by me: Sinus rhythm rate 66 bpm  Data reviewed by me 03/24/2023: last 24h vitals tele labs imaging I/O ED provider note, admission H&P  Principal Problem:   NSTEMI (non-ST elevated myocardial infarction) (HCC)    ASSESSMENT AND PLAN:  Stephannie Broner is a 89 y.o. female  with a past medical history of paroxysmal SVT, hypertension, COPD, GERD who presented to the ED on 03/24/2023 for fall.  She reportedly had also told her PCP earlier today that she had chest pain, troponin was checked and was found to be elevated.  Cardiology was consulted for further evaluation.   # Chest pain # Elevated troponin # Fall Patient had onset of chest pain and dizziness this morning with associated elevated heart rate.  Had a fall at her facility following symptom onset.  EMS was called and she was brought to the ED for further evaluation.  Troponins 883 > 1073.  Other workup relatively unremarkable.  Had LHC 2019 at hospital in [place] after similar presentation which revealed essentially normal coronaries. -Echo ordered -Continue to trend troponins. -Can continue heparin for now. -Elevated troponin most consistent with demand/supply mismatch and not ACS in the setting of SVT. -Will likely plan for monitor on discharge for further evaluation of SVT.  # Supraventricular tachycardia Patient with history of SVT episodes for many years.  Wore Zio monitor 01/2021 which revealed occasional SVT episodes. -Continue home metoprolol succinate 25 mg daily.   This patient's plan of care was discussed and created with Dr. Darrold Junker and he is in  agreement.  Signed: Gale Journey, PA-C  03/24/2023, 2:53 PM [facility] Cardiology

## 2023-03-24 NOTE — H&P (Signed)
 [facility] History and Physical   Patient: Ann Welch NWG:[id number]   PCP: [facility] DOB: [birth year]   DOA: 03/24/2023   DOS: 03/24/2023   DOS: the patient was seen and examined on 03/24/2023  Patient coming from: The patient is coming from SNF/senior home  Chief Complaint: Chest pressure, A-fib with RVR, dizziness and fall  HPI: Ann Welch is a 89 y.o. female with PMH of paroxysmal A-fib, HTN, COPD, Mnire's disease, hard of hearing, anxiety, RLS, OSA, OA, GERD, as reviewed from EMR, presented to [facility] ED from senior housing with complaining of chest pain woke her up from the sleep.  Patient noticed heart rate in 150s and she took Cardizem and then she took aspirin.  Patient felt dizzy and fell hit her left knee, able to ambulate does not feel she had fracture.  She denied any chest pain.  Patient was brought in to the ED by EMS, physician from the facility mentioned that patient had chest pain and needed a chest pain workup.  On further questioning patient did say that she had chest pressure but not pain.  Chest pressure is in the center of the chest 5/10, no radiation, did not describe much.     ED Course: VS afebrile, HR 58, RR 19 BP 109/65, 96% on room air Troponin 883--- 1073 BMP sodium 130, BUN 20, serum osmolality 275, TSH 0.6 CBC within normal range EKG sinus rhythm, anterior lateral T wave abnormality  Cardiology was consulted [facility] was consulted for admission and further management as below  Review of Systems: as mentioned in the history of present illness.  All other systems reviewed and are negative.  Past Medical History:  Diagnosis Date   Anginal pain (HCC)    Anxiety    Arthritis    COPD (chronic obstructive pulmonary disease) (HCC)    Dyspnea    Dysrhythmia    Hx of A-Fib   GERD (gastroesophageal reflux disease)    Headache    1/week   History of recent hospitalization 05/05/2017   In [place] for low BP and angina  with Heart Cath, no significant findings, Surgical clearance in chart [facility] (hard of hearing)    right ear   Hypertension    Liver mass    Meniere disease    Orthopnea    Restless leg syndrome    Sleep apnea    CPAP   Wears dentures    upper   Past Surgical History:  Procedure Laterality Date   BREAST BIOPSY     CARDIAC CATHETERIZATION     CATARACT EXTRACTION W/PHACO Right 07/05/2017   Procedure: CATARACT EXTRACTION PHACO AND INTRAOCULAR LENS PLACEMENT (IOC) RIGHT;  Surgeon: Lockie Mola, MD;  Location: [facility];  Service: Ophthalmology;  Laterality: Right;  Sleep Apnea-CPAP Spanish speaking-understands English   CATARACT EXTRACTION W/PHACO Left 10/06/2017   Procedure: CATARACT EXTRACTION PHACO AND INTRAOCULAR LENS PLACEMENT (IOC)  LEFT PRE DIABETIC;  Surgeon: Lockie Mola, MD;  Location: [facility];  Service: Ophthalmology;  Laterality: Left;   COLONOSCOPY     COLONOSCOPY WITH PROPOFOL N/A 10/10/2019   Procedure: COLONOSCOPY WITH PROPOFOL;  Surgeon: [name], MD;  Location: [facility] ENDOSCOPY;  Service: Gastroenterology;  Laterality: N/A;   Social History:  reports that she has never smoked. She has never used smokeless tobacco. She reports that she does not drink alcohol and does not use drugs.  Allergies  Allergen Reactions  Chocolate     HA     Family history reviewed and not pertinent Family History  Problem Relation Age of Onset   Dementia Sister    Cancer Sister        Not sure which type of cancer     Prior to Admission medications   Medication Sig Start Date End Date Taking? Authorizing Provider  alum & mag hydroxide-simeth (MAALOX/MYLANTA) 200-200-20 MG/5ML suspension Take by mouth every 6 (six) hours as needed for indigestion or heartburn.    [provider]  Artificial Saliva (BIOTENE DRY MOUTH MOISTURIZING) SOLN Take 2 sprays by mouth 6 (six) times daily. AS NEEDED 09/07/19    [provider]  aspirin EC 81 MG tablet Take 81 mg by mouth at bedtime. pm    [provider]  cholecalciferol (VITAMIN D) 1000 units tablet Take 1,000 Units by mouth daily.    [provider]  diltiazem (CARDIZEM) 60 MG tablet Take 30 mg by mouth every 4 (four) hours as needed. Call MD if you take more than 2 doses in 24 hours 06/14/19   [provider]  ketotifen (ZADITOR) 0.025 % ophthalmic solution Place 1 drop into both eyes 2 (two) times daily as needed for dry eyes. 09/27/19   [provider]  meclizine (ANTIVERT) 25 MG tablet Take 25 mg by mouth 3 (three) times daily as needed for dizziness.    [provider]  Melatonin 5 MG TABS Take 2 tablets by mouth at bedtime.     [provider]  metoprolol succinate (TOPROL XL) 25 MG 24 hr tablet Take 2 tablets (50 mg total) by mouth daily. 10/06/20 11/05/20  Georga Hacking, MD  metoprolol succinate (TOPROL-XL) 25 MG 24 hr tablet Take 25 mg by mouth at bedtime.    [provider]  Multiple Vitamins-Minerals (CENTRUM SILVER 50+WOMEN PO) Take by mouth.    [provider]  nitroGLYCERIN (NITROSTAT) 0.4 MG SL tablet Place 0.4 mg under the tongue every 5 (five) minutes as needed for chest pain.    [provider]  Omeprazole 20 MG TBEC Take 20 mg by mouth every morning. 06/09/19   [provider]  polyethylene glycol (MIRALAX / GLYCOLAX) packet Take 17 g by mouth daily.    [provider]  traZODone (DESYREL) 50 MG tablet Take 25 mg by mouth at bedtime. 09/27/19   [provider]  vitamin E 400 UNIT capsule Take 400 Units by mouth daily.    [provider]    Physical Exam: Vitals:   03/24/23 1052 03/24/23 1322 03/24/23 1400  BP: 109/65  117/62  Pulse: (!) 58 63 62  Resp: 19 (!) 26 (!) 22  Temp: 98.1 F (36.7 C)    TempSrc: Oral    SpO2: 96% 94% 97%  Weight: 64.4 kg    Height: 5\' 3"  (1.6 m)      General: alert and  oriented to time, place, and person. Appear in mild distress, affect appropriate Eyes: PERRLA, Conjunctiva normal ENT: Oral Mucosa Clear, moist  Neck: no JVD, no Abnormal Mass Or lumps Cardiovascular: S1 and S2 Present, no Murmur, peripheral pulses symmetrical Respiratory: good respiratory effort, Bilateral Air entry equal and Decreased, no signs of accessory muscle use, Clear to Auscultation, no Crackles, no wheezes Abdomen: Bowel Sound present, Soft and no tenderness, no hernia Skin: no rashes  Extremities: no Pedal edema, no calf tenderness Neurologic: without any new focal findings Gait not checked due to patient safety concerns  Data Reviewed: I have personally reviewed and interpreted labs, imaging as discussed below.  CBC: Recent Labs  Lab 03/24/23 1251  WBC 9.2  HGB 12.7  HCT 38.4  MCV 93.0  PLT 170   Basic Metabolic Panel: Recent Labs  Lab 03/24/23 1251  NA 130*  K 4.1  CL 97*  CO2 23  GLUCOSE 120*  BUN 16  CREATININE 0.68  CALCIUM 9.4   GFR: Estimated Creatinine Clearance: 42.2 mL/min (by C-G formula based on SCr of 0.68 mg/dL). Liver Function Tests: No results for input(s): "AST", "ALT", "ALKPHOS", "BILITOT", "PROT", "ALBUMIN" in the last 168 hours. No results for input(s): "LIPASE", "AMYLASE" in the last 168 hours. No results for input(s): "AMMONIA" in the last 168 hours. Coagulation Profile: No results for input(s): "INR", "PROTIME" in the last 168 hours. Cardiac Enzymes: No results for input(s): "CKTOTAL", "CKMB", "CKMBINDEX", "TROPONINI" in the last 168 hours. BNP (last 3 results) No results for input(s): "PROBNP" in the last 8760 hours. HbA1C: No results for input(s): "HGBA1C" in the last 72 hours. CBG: No results for input(s): "GLUCAP" in the last 168 hours. Lipid Profile: No results for input(s): "CHOL", "HDL", "LDLCALC", "TRIG", "CHOLHDL", "LDLDIRECT" in the last 72 hours. Thyroid Function Tests: No results for input(s): "TSH", "T4TOTAL",  "FREET4", "T3FREE", "THYROIDAB" in the last 72 hours. Anemia Panel: No results for input(s): "VITAMINB12", "FOLATE", "FERRITIN", "TIBC", "IRON", "RETICCTPCT" in the last 72 hours. Urine analysis:    Component Value Date/Time   COLORURINE YELLOW (A) 09/06/2021 1617   APPEARANCEUR HAZY (A) 09/06/2021 1617   LABSPEC 1.021 09/06/2021 1617   PHURINE 8.0 09/06/2021 1617   GLUCOSEU NEGATIVE 09/06/2021 1617   HGBUR NEGATIVE 09/06/2021 1617   BILIRUBINUR NEGATIVE 09/06/2021 1617   KETONESUR NEGATIVE 09/06/2021 1617   PROTEINUR NEGATIVE 09/06/2021 1617   NITRITE POSITIVE (A) 09/06/2021 1617   LEUKOCYTESUR NEGATIVE 09/06/2021 1617    Radiological Exams on Admission: DG Chest 2 View Result Date: 03/24/2023 CLINICAL DATA:  Chest pain after fall. EXAM: CHEST - 2 VIEW COMPARISON:  Chest radiograph dated 10/06/2020. FINDINGS: The heart size and mediastinal contours are within normal limits. No focal consolidation, pleural effusion, or pneumothorax. Diffuse osseous demineralization. Moderate compression deformities of the T12 and L1 vertebral bodies are again noted. Age-indeterminate superior endplate compression deformity of the T8 vertebral body. No obvious displaced rib fracture. IMPRESSION: 1. Mild right basilar atelectasis. Otherwise, no acute cardiopulmonary findings. 2. Age-indeterminate superior endplate compression deformity of the T8 vertebral body. 3. Redemonstrated moderate compression deformities of the T12 and L1 vertebral bodies. Electronically Signed   By: Hart Robinsons M.D.   On: 03/24/2023 14:40   DG Knee Complete 4 Views Left Result Date: 03/24/2023 CLINICAL DATA:  Pain after fall.  Swelling EXAM: LEFT KNEE - COMPLETE 4 VIEW COMPARISON:  None Available. FINDINGS: Osteopenia. Moderate joint space loss of the medial compartment with some small osteophytes. Preserved lateral compartment and patellofemoral joint. No fracture or dislocation. No joint effusion on lateral view. IMPRESSION:  Degenerative changes of the medial compartment.  Osteopenia Electronically Signed   By: Karen Kays M.D.   On: 03/24/2023 12:24   EKG: Independently reviewed. sinus rhythm, anterior lateral T wave abnormalities  I reviewed all nursing notes, pharmacy notes, vitals, pertinent old records.  Assessment/Plan Principal Problem:   NSTEMI (non-ST elevated myocardial infarction) (HCC)  # NSTEMI (non-ST elevated myocardial infarction) possible demand ischemia due to tachycardia Troponin elevated, continue to trend Continue aspirin 81 mg p.o. daily, Nitro as needed for chest pain Started  heparin IV infusion, pharmacy dosing and PTT monitoring as per protocol Continue to monitor on telemetry Cardiology consulted   # Paroxysmal A-fib with RVR, back to sinus rhythm Patient is not on DOAC Continue to monitor on telemetry Resumed Toprol-XL home dose Use Cardizem IV as needed TSH 0.6 at lower end, follow free T4 level   # Isotonic hyponatremia, most likely due to decreased oral intake Sodium 130 Serum osmolality 275 within normal range Started sodium chloride 1 g p.o. 3 times daily for 3 days Monitor sodium level daily  Fall and dizziness most likely due to A-fib with RVR Left knee impression Left knee x-ray negative for any fracture Continue fall precautions PRN meds for pain control   Nutrition: Regular diet DVT Prophylaxis: Therapeutic Anticoagulation with heparin IV infusion  Advance goals of care discussion: DNR   Consults: I personally Discussed with cardiology  Family Communication: family was present at bedside, at the time of interview.  Opportunity was given to ask question and all questions were answered satisfactorily.  Disposition: Admitted as inpatient, Cardiac telemetry unit. Likely to be discharged SNF senior housing, in 1-2 days when cleared by cardiology.  I have discussed plan of care as described above with RN and patient/family.  Severity of Illness: The  appropriate patient status for this patient is INPATIENT. Inpatient status is judged to be reasonable and necessary in order to provide the required intensity of service to ensure the patient's safety. The patient's presenting symptoms, physical exam findings, and initial radiographic and laboratory data in the context of their chronic comorbidities is felt to place them at high risk for further clinical deterioration. Furthermore, it is not anticipated that the patient will be medically stable for discharge from the hospital within 2 midnights of admission.   * I certify that at the point of admission it is my clinical judgment that the patient will require inpatient hospital care spanning beyond 2 midnights from the point of admission due to high intensity of service, high risk for further deterioration and high frequency of surveillance required.*   Author: Gillis Santa, MD [facility] 03/24/2023 2:47 PM   To reach On-call, see care teams to locate the attending and reach out to them via [email or web address]. If 7PM-7AM, please contact night-coverage If you still have difficulty reaching the attending provider, please page the [facility] (Director on Call) for [facility] on amion for assistance.

## 2023-03-24 NOTE — ED Notes (Addendum)
 This RN received call from [facility] MD stating that they were call to check on patient due to having CP this AM and dizziness that caused her fall. This RN made MD aware that she did not state the same to ED RN's or EMS. MD asking for CP workup.   This RN spoke to pt regarding same and is agreeable to workup. Patient discussed with Anner Crete, MD and verbal orders given.

## 2023-03-24 NOTE — ED Notes (Signed)
 Swelling noted to left knee pt reports falling this date resulting in injury to left knee pt provided pain relief medication ordered pt socks removed pt repositioned for comfort

## 2023-03-24 NOTE — ED Triage Notes (Signed)
 Pt arrived via EMS. Pt sts that she fell at home and EMS was called to help her up. Pt refused EMS at that time and pt was going to her PCP. Pt called said PCP and they called EMS for her due to her fall. Pt complains of knee pain with a bruise however pt sts it is not broke as I am able to walk on it. I am just fine. Pt is not on blood thinners.

## 2023-03-25 ENCOUNTER — Encounter: Payer: Self-pay | Admitting: Student

## 2023-03-25 ENCOUNTER — Inpatient Hospital Stay: Payer: Medicare (Managed Care)

## 2023-03-25 ENCOUNTER — Other Ambulatory Visit: Payer: Self-pay

## 2023-03-25 DIAGNOSIS — S8002XA Contusion of left knee, initial encounter: Secondary | ICD-10-CM

## 2023-03-25 DIAGNOSIS — I214 Non-ST elevation (NSTEMI) myocardial infarction: Secondary | ICD-10-CM | POA: Diagnosis not present

## 2023-03-25 LAB — CBC
HCT: 33.6 % — ABNORMAL LOW (ref 36.0–46.0)
Hemoglobin: 11.1 g/dL — ABNORMAL LOW (ref 12.0–15.0)
MCH: 30.4 pg (ref 26.0–34.0)
MCHC: 33 g/dL (ref 30.0–36.0)
MCV: 92.1 fL (ref 80.0–100.0)
Platelets: 148 K/uL — ABNORMAL LOW (ref 150–400)
RBC: 3.65 MIL/uL — ABNORMAL LOW (ref 3.87–5.11)
RDW: 12.9 % (ref 11.5–15.5)
WBC: 7.7 K/uL (ref 4.0–10.5)
nRBC: 0 % (ref 0.0–0.2)

## 2023-03-25 LAB — ECHOCARDIOGRAM COMPLETE
Area-P 1/2: 3.24 cm2
Height: 63 in
S' Lateral: 2.7 cm
Weight: 2272 [oz_av]

## 2023-03-25 LAB — BASIC METABOLIC PANEL WITH GFR
Anion gap: 8 (ref 5–15)
BUN: 11 mg/dL (ref 8–23)
CO2: 23 mmol/L (ref 22–32)
Calcium: 8.9 mg/dL (ref 8.9–10.3)
Chloride: 101 mmol/L (ref 98–111)
Creatinine, Ser: 0.65 mg/dL (ref 0.44–1.00)
GFR, Estimated: >60 mL/min (ref >60)
Glucose, Bld: 108 mg/dL — ABNORMAL HIGH (ref 70–99)
Potassium: 4.2 mmol/L (ref 3.5–5.1)
Sodium: 132 mmol/L — ABNORMAL LOW (ref 135–145)

## 2023-03-25 LAB — PHOSPHORUS: Phosphorus: 3.4 mg/dL (ref 2.5–4.6)

## 2023-03-25 LAB — MAGNESIUM: Magnesium: 2.1 mg/dL (ref 1.7–2.4)

## 2023-03-25 LAB — HEPARIN LEVEL (UNFRACTIONATED)
Heparin Unfractionated: 0.47 [IU]/mL (ref 0.30–0.70)
Heparin Unfractionated: 0.58 [IU]/mL (ref 0.30–0.70)

## 2023-03-25 MED ORDER — METOPROLOL SUCCINATE ER 50 MG PO TB24
50.0000 mg | ORAL_TABLET | Freq: Every day | ORAL | Status: DC
  Administered 2023-03-25 – 2023-03-28 (×4): 50 mg via ORAL
  Filled 2023-03-25 (×4): qty 1

## 2023-03-25 MED ORDER — NALOXONE HCL 0.4 MG/ML IJ SOLN
INTRAMUSCULAR | Status: AC
Start: 2023-03-25 — End: 2023-03-25
  Filled 2023-03-25: qty 1

## 2023-03-25 MED ORDER — IOHEXOL 300 MG/ML  SOLN
100.0000 mL | Freq: Once | INTRAMUSCULAR | Status: AC | PRN
  Administered 2023-03-25: 100 mL via INTRAVENOUS

## 2023-03-25 NOTE — ED Notes (Signed)
 CPAP removed. Pt on RA and o2 sats 95%. Breakfast set up and pt eating

## 2023-03-25 NOTE — ED Notes (Signed)
 ED Provider at bedside.

## 2023-03-25 NOTE — Consult Note (Signed)
 Pharmacy Consult Note - Anticoagulation  Pharmacy Consult for heparin Indication: chest pain/ACS  PATIENT MEASUREMENTS: Height: 5\' 3"  (160 cm) Weight: 64.4 kg (142 lb) IBW/kg (Calculated) : 52.4 HEPARIN DW (KG): 64.4  VITAL SIGNS: Temp: 98.5 F (36.9 C) (02/27 0045) Temp Source: Oral (02/27 0045) BP: 123/59 (02/27 0045) Pulse Rate: 66 (02/27 0045)  Recent Labs    03/24/23 1251 03/24/23 1446 03/24/23 1726 03/24/23 2040 03/25/23 0010  HGB 12.7  --   --   --   --   HCT 38.4  --   --   --   --   PLT 170  --   --   --   --   APTT  --  34  --   --   --   LABPROT  --  13.4  --   --   --   INR  --  1.0  --   --   --   HEPARINUNFRC  --   --   --   --  0.58  CREATININE 0.68  --   --   --   --   TROPONINIHS 883* 1,073*   < > 929*  --    < > = values in this interval not displayed.    Estimated Creatinine Clearance: 42.2 mL/min (by C-G formula based on SCr of 0.68 mg/dL).  PAST MEDICAL HISTORY: Past Medical History:  Diagnosis Date   Anginal pain (HCC)    Anxiety    Arthritis    COPD (chronic obstructive pulmonary disease) (HCC)    Dyspnea    Dysrhythmia    Hx of A-Fib   GERD (gastroesophageal reflux disease)    Headache    1/week   History of recent hospitalization 05/05/2017   In [place] for low BP and angina with Heart Cath, no significant findings, Surgical clearance in chart [facility] (hard of hearing)    right ear   Hypertension    Liver mass    Meniere disease    Orthopnea    Restless leg syndrome    Sleep apnea    CPAP   Wears dentures    upper    ASSESSMENT: 89 y.o. female with PMH including Afib (not on anticoagulation), HTN, GIB is presenting with knee pain after a fall secondary to chest pain and dizziness this morning. cTn elevated. Patient is not on chronic anticoagulation per chart review. Knee imaging negative for trauma and Hgb and PLT are stable. Pharmacy has been consulted to initiate and manage heparin intravenous  infusion.  Pertinent medications: No chronic anticoagulation prior to admission per chart review Aspirin 81 mg daily  Goal(s) of therapy: Heparin level 0.3 - 0.7 units/mL Monitor platelets by anticoagulation protocol: Yes   Baseline anticoagulation labs: Recent Labs    03/24/23 1251 03/24/23 1446  APTT  --  34  INR  --  1.0  HGB 12.7  --   PLT 170  --     Date Time aPTT/HL Rate/Comment 02/27   0010      0.58              Therapeutic X 1      PLAN: 2/27:  HL @ 0010 = 0.58, therapeutic X 1 - Will continue pt on current rate and recheck HL in 8 hrs.  Monitor CBC daily while on heparin infusion.  Yitzel Shasteen D Clinical Pharmacist 03/25/2023 12:50 AM

## 2023-03-25 NOTE — Progress Notes (Signed)
       CROSS COVER NOTE  NAME: Ann Welch MRN: [id number] DOB : [birth year] ATTENDING PHYSICIAN: Gillis Santa, MD    Date of Service   03/25/2023   HPI/Events of Note   Page from [facility] Radiology regarding CT left knee IMPRESSION: Large area of soft tissue density identified anterior superficial to the patella and associated tendons measuring 7.7 x 2.5 x 12.8 cm. By density this could be a hematoma as per history. There is also a smaller subset focus measuring 2.4 cm in diameter with a fluid-fluid level, possible more acute hematoma. And immediately lateral to this area is a small linear area of increased density which with the findings consistent with a small area of active extravasation.   Osteopenia. No underlying acute fracture of the knee. Tiny joint effusion. Degenerative changes seen greatest of the medial compartment.   Critical Value/emergent results were called by telephone at the time of interpretation on 03/25/2023 at 8:07 pm to provider Manuela Schwartz, who verbally acknowledged these results.   Results communicated to Dr Audelia Acton via secure chat   Interventions   Assessment/Plan: Per ortho elevate and compression overnight        Donnie Mesa NP [facility] Cross Cover 7pm-7am - check amion for availability Pager [phone]

## 2023-03-25 NOTE — Plan of Care (Signed)
  Problem: Education: Goal: Knowledge of General Education information will improve Description: Including pain rating scale, medication(s)/side effects and non-pharmacologic comfort measures 03/25/2023 2348 by Myles Gip, RN Outcome: Progressing 03/25/2023 2341 by Myles Gip, RN Outcome: Progressing   Problem: Clinical Measurements: Goal: Respiratory complications will improve 03/25/2023 2348 by Myles Gip, RN Outcome: Progressing 03/25/2023 2341 by Myles Gip, RN Outcome: Progressing   Problem: Pain Managment: Goal: General experience of comfort will improve and/or be controlled 03/25/2023 2348 by Myles Gip, RN Outcome: Progressing 03/25/2023 2341 by Myles Gip, RN Outcome: Progressing   Problem: Safety: Goal: Ability to remain free from injury will improve 03/25/2023 2348 by Myles Gip, RN Outcome: Progressing 03/25/2023 2341 by Myles Gip, RN Outcome: Progressing

## 2023-03-25 NOTE — Progress Notes (Signed)
 [facility] Progress Note  Patient: Ann Welch    ZOX:[id number]  DOA: 03/24/2023     Date of Service: the patient was seen and examined on 03/25/2023  Chief Complaint  Patient presents with   Fall   Brief hospital course: Ann Welch is a 89 y.o. female with PMH of paroxysmal A-fib, HTN, COPD, Mnire's disease, hard of hearing, anxiety, RLS, OSA, OA, GERD, as reviewed from EMR, presented to [facility] ED from senior housing with complaining of chest pain woke her up from the sleep.  Patient noticed heart rate in 150s and she took Cardizem and then she took aspirin.  Patient felt dizzy and fell hit her left knee, able to ambulate does not feel she had fracture.  She denied any chest pain.  Patient was brought in to the ED by EMS, physician from the facility mentioned that patient had chest pain and needed a chest pain workup.  On further questioning patient did say that she had chest pressure but not pain.  Chest pressure is in the center of the chest 5/10, no radiation, did not describe much.       ED Course: VS afebrile, HR 58, RR 19 BP 109/65, 96% on room air Troponin 883--- 1073 BMP sodium 130, BUN 20, serum osmolality 275, TSH 0.6 CBC within normal range EKG sinus rhythm, anterior lateral T wave abnormality   Cardiology was consulted TRH was consulted for admission and further management as below   Assessment and Plan:  # NSTEMI, demand ischemia due to tachycardia Troponin elevated, continue to trend Continue aspirin 81 mg p.o. daily, Nitro as needed for chest pain S/p heparin IV infusion DC'd in a.m. on 2/27 as per cardio TTE LVEF 60 to 65%, no WMA grade 1 diastolic dysfunction. Continue to monitor on telemetry Cardiology consulted, recommended no further intervention     # Paroxysmal SVT, back to sinus rhythm Patient is not on DOAC Continue to monitor on telemetry Resumed Toprol-XL home dose Use Cardizem IV as needed TSH 0.6 at lower end, follow free T4  level Cardiology consulted, recommended loop monitor to monitor arrhythmias.   Cleared for discharge and follow-up as an outpatient.  # Left knee hematoma s/p fall, and received heparin IV gtt which may have aggravated hematoma. Anticoagulation discontinued Discontinued aspirin as well Follow CT left knee Follow orthopedic surgery   # Isotonic hyponatremia, most likely due to decreased oral intake Sodium 130 Serum osmolality 275 within normal range Started sodium chloride 1 g p.o. 3 times daily for 3 days Monitor sodium level daily   # Fall and dizziness most likely due to A-fib with RVR Left knee impression Left knee x-ray negative for any fracture Continue fall precautions PRN meds for pain control  Body mass index is 25.15 kg/m.  Interventions:  Diet: Heart healthy diet DVT Prophylaxis: SCD, pharmacological prophylaxis contraindicated due to bleeding    Advance goals of care discussion: DNR-limited  Family Communication: family was not present at bedside, at the time of interview.  The pt provided permission to discuss medical plan with the family. Opportunity was given to ask question and all questions were answered satisfactorily.   Disposition:  Pt is from Home, admitted with chest pain, SVTs, fall with left knee hematoma, s/p Hep gtt, orthopedic consulted for left knee hematoma, which precludes a safe discharge. Discharge to home, when stable, may need few days to improve.  Subjective: No significant events overnight, patient was having significant pain in the left knee and  also had early morning chest pain which resolved after nitroglycerin and her blood pressure dropped which improved after some time.  No chest pain now but complaining of severe pain in the left knee.  Physical Exam: General: NAD, lying comfortably Appear in no distress, affect appropriate Eyes: PERRLA ENT: Oral Mucosa Clear, moist  Neck: no JVD,  Cardiovascular: S1 and S2 Present, no Murmur,   Respiratory: good respiratory effort, Bilateral Air entry equal and Decreased, no Crackles, no wheezes Abdomen: Bowel Sound present, Soft and no tenderness,  Skin: no rashes Extremities: Left knee swelling and bruised, seems hematoma Neurologic: without any new focal findings Gait not checked due to patient safety concerns  Vitals:   03/25/23 1200 03/25/23 1314 03/25/23 1400 03/25/23 1517  BP: 135/71 119/72 123/63 (!) 142/68  Pulse: 68 67 66 61  Resp: 12 17 18 16   Temp:  98 F (36.7 C)  98.3 F (36.8 C)  TempSrc:  Oral  Oral  SpO2: 100% 99% 100% (!) 89%  Weight:      Height:        Intake/Output Summary (Last 24 hours) at 03/25/2023 1653 Last data filed at 03/25/2023 1300 Gross per 24 hour  Intake 480 ml  Output 800 ml  Net -320 ml   Filed Weights   03/24/23 1052  Weight: 64.4 kg    Data Reviewed: I have personally reviewed and interpreted daily labs, tele strips, imagings as discussed above. I reviewed all nursing notes, pharmacy notes, vitals, pertinent old records I have discussed plan of care as described above with RN and patient/family.  CBC: Recent Labs  Lab 03/24/23 1251 03/25/23 0435  WBC 9.2 7.7  HGB 12.7 11.1*  HCT 38.4 33.6*  MCV 93.0 92.1  PLT 170 148*   Basic Metabolic Panel: Recent Labs  Lab 03/24/23 1251 03/24/23 1446 03/25/23 0435  NA 130*  --  132*  K 4.1  --  4.2  CL 97*  --  101  CO2 23  --  23  GLUCOSE 120*  --  108*  BUN 16  --  11  CREATININE 0.68  --  0.65  CALCIUM 9.4  --  8.9  MG  --  2.3 2.1  PHOS  --  3.7 3.4    Studies: ECHOCARDIOGRAM COMPLETE Result Date: 03/25/2023    ECHOCARDIOGRAM REPORT   Patient Name:   Ann Welch Date of Exam: 03/24/2023 Medical Rec #:  [id number]       Height:       63.0 in Accession #:    [id number]      Weight:       142.0 lb Date of Birth:  08-02-1931       BSA:          1.672 m Patient Age:    89 years        BP:           109/65 mmHg Patient Gender: F               HR:           63 bpm.  Exam Location:  [facility] Procedure: 2D Echo, Cardiac Doppler and Color Doppler (Both Spectral and Color            Flow Doppler were utilized during procedure). Indications:     I21.4 NSTEMI  History:         Patient has no prior history of Echocardiogram examinations.  COPD, Signs/Symptoms:Dyspnea; Risk Factors:Hypertension and                  Sleep Apnea.  Sonographer:     Daphine Deutscher RDCS Referring Phys:  [id number] Dorian Pod HUDSON Diagnosing Phys: Marcina Millard MD IMPRESSIONS  1. Left ventricular ejection fraction, by estimation, is 60 to 65%. The left ventricle has normal function. The left ventricle has no regional wall motion abnormalities. There is mild left ventricular hypertrophy. Left ventricular diastolic parameters are consistent with Grade I diastolic dysfunction (impaired relaxation).  2. Right ventricular systolic function is normal. The right ventricular size is normal.  3. The mitral valve is normal in structure. Mild mitral valve regurgitation. No evidence of mitral stenosis.  4. The aortic valve is normal in structure. Aortic valve regurgitation is mild. No aortic stenosis is present.  5. The inferior vena cava is normal in size with greater than 50% respiratory variability, suggesting right atrial pressure of 3 mmHg. FINDINGS  Left Ventricle: Left ventricular ejection fraction, by estimation, is 60 to 65%. The left ventricle has normal function. The left ventricle has no regional wall motion abnormalities. Strain imaging was not performed. The left ventricular internal cavity  size was normal in size. There is mild left ventricular hypertrophy. Left ventricular diastolic parameters are consistent with Grade I diastolic dysfunction (impaired relaxation). Right Ventricle: The right ventricular size is normal. No increase in right ventricular wall thickness. Right ventricular systolic function is normal. Left Atrium: Left atrial size was normal in size. Right Atrium: Right  atrial size was normal in size. Pericardium: There is no evidence of pericardial effusion. Mitral Valve: The mitral valve is normal in structure. Mild mitral valve regurgitation. No evidence of mitral valve stenosis. Tricuspid Valve: The tricuspid valve is normal in structure. Tricuspid valve regurgitation is mild . No evidence of tricuspid stenosis. Aortic Valve: The aortic valve is normal in structure. Aortic valve regurgitation is mild. No aortic stenosis is present. Pulmonic Valve: The pulmonic valve was normal in structure. Pulmonic valve regurgitation is not visualized. No evidence of pulmonic stenosis. Aorta: The aortic root is normal in size and structure. Venous: The inferior vena cava is normal in size with greater than 50% respiratory variability, suggesting right atrial pressure of 3 mmHg. IAS/Shunts: No atrial level shunt detected by color flow Doppler. Additional Comments: 3D imaging was not performed.  LEFT VENTRICLE PLAX 2D LVIDd:         4.60 cm Diastology LVIDs:         2.70 cm LV e' medial:    7.18 cm/s LV PW:         0.80 cm LV E/e' medial:  10.0 LV IVS:        0.80 cm LV e' lateral:   10.02 cm/s                        LV E/e' lateral: 7.2  RIGHT VENTRICLE             IVC RV Basal diam:  3.50 cm     IVC diam: 1.30 cm RV S prime:     12.83 cm/s TAPSE (M-mode): 2.6 cm LEFT ATRIUM             Index        RIGHT ATRIUM           Index LA diam:        4.10 cm 2.45 cm/m   RA Area:  13.30 cm LA Vol (A2C):   33.6 ml 20.10 ml/m  RA Volume:   34.30 ml  20.52 ml/m LA Vol (A4C):   28.0 ml 16.75 ml/m LA Biplane Vol: 30.6 ml 18.30 ml/m  AORTIC VALVE LVOT Vmax:   133.00 cm/s LVOT Vmean:  85.450 cm/s LVOT VTI:    0.266 m  AORTA Ao Root diam: 3.40 cm Ao Asc diam:  3.50 cm MITRAL VALVE MV Area (PHT): 3.24 cm     SHUNTS MV Decel Time: 234 msec     Systemic VTI: 0.27 m MV E velocity: 71.80 cm/s MV A velocity: 111.00 cm/s MV E/A ratio:  0.65 Marcina Millard MD Electronically signed by Marcina Millard MD Signature Date/Time: 03/25/2023/1:10:55 PM    Final     Scheduled Meds:  metoprolol succinate  50 mg Oral Daily   naloxone       pantoprazole  40 mg Oral Daily   sodium chloride flush  3 mL Intravenous Q12H   sodium chloride  1 g Oral TID WC   traZODone  25 mg Oral QHS   Continuous Infusions: PRN Meds: acetaminophen **OR** acetaminophen, diltiazem, iohexol, morphine injection, naloxone, nitroGLYCERIN, ondansetron **OR** ondansetron (ZOFRAN) IV, polyethylene glycol, sodium chloride flush  Time spent: 55 minutes  Author: Gillis Santa. MD [facility] 03/25/2023 4:53 PM  To reach On-call, see care teams to locate the attending and reach out to them via [email or web address]. If 7PM-7AM, please contact night-coverage If you still have difficulty reaching the attending provider, please page the [facility] (Director on Call) for [facility] on amion for assistance.

## 2023-03-25 NOTE — ED Notes (Signed)
 Resp notified pt requires cpap per order NP Jon Billings and pt report home use of Cpap @ HS

## 2023-03-25 NOTE — ED Notes (Signed)
 Pt up to [facility] assistance required pt back into hospital bed assistance required

## 2023-03-25 NOTE — ED Notes (Signed)
 EKG obtained @ bs

## 2023-03-25 NOTE — Progress Notes (Signed)
 Left Knee elevated with compression on per provider. Will continue to monitor.

## 2023-03-25 NOTE — Progress Notes (Signed)
 Md at bedside, CT ordered

## 2023-03-25 NOTE — Consult Note (Signed)
 ORTHOPAEDIC CONSULTATION  REQUESTING PHYSICIAN: Gillis Santa, MD  Chief Complaint:   Left knee hematoma  History of Present Illness: Ann Welch is a 89 y.o. female  with PMH of paroxysmal A-fib, HTN, COPD, Mnire's disease, hard of hearing, anxiety, RLS, OSA, OA, GERD who presented from senior housing with dizziness and chest pain and had a fall landing on her left knee.  She was able to ambulate after the injury after being seen in the emergency room she was placed on a heparin drip for NSTEMI and her hematoma on her left knee worsened.  She denies any wound or drainage from the knee.  She is able to straight leg raise the knee and move the knee with some discomfort but complains mostly of just swelling to the knee.  She denies any numbness or tingling at this time.  Past Medical History:  Diagnosis Date   Anginal pain (HCC)    Anxiety    Arthritis    COPD (chronic obstructive pulmonary disease) (HCC)    Dyspnea    Dysrhythmia    Hx of A-Fib   GERD (gastroesophageal reflux disease)    Headache    1/week   History of recent hospitalization 05/05/2017   In [place] for low BP and angina with Heart Cath, no significant findings, Surgical clearance in chart [facility] (hard of hearing)    right ear   Hypertension    Liver mass    Meniere disease    Orthopnea    Restless leg syndrome    Sleep apnea    CPAP   Wears dentures    upper   Past Surgical History:  Procedure Laterality Date   BREAST BIOPSY     CARDIAC CATHETERIZATION     CATARACT EXTRACTION W/PHACO Right 07/05/2017   Procedure: CATARACT EXTRACTION PHACO AND INTRAOCULAR LENS PLACEMENT (IOC) RIGHT;  Surgeon: Lockie Mola, MD;  Location: [facility];  Service: Ophthalmology;  Laterality: Right;  Sleep Apnea-CPAP Spanish speaking-understands English   CATARACT EXTRACTION W/PHACO Left 10/06/2017   Procedure:  CATARACT EXTRACTION PHACO AND INTRAOCULAR LENS PLACEMENT (IOC)  LEFT PRE DIABETIC;  Surgeon: Lockie Mola, MD;  Location: [facility];  Service: Ophthalmology;  Laterality: Left;   COLONOSCOPY     COLONOSCOPY WITH PROPOFOL N/A 10/10/2019   Procedure: COLONOSCOPY WITH PROPOFOL;  Surgeon: [name], MD;  Location: [facility];  Service: Gastroenterology;  Laterality: N/A;   Social History   Socioeconomic History   Marital status: Married    Spouse name: Not on file   Number of children: Not on file   Years of education: Not on file   Highest education level: Not on file  Occupational History   Not on file  Tobacco Use   Smoking status: Never   Smokeless tobacco: Never  Vaping Use   Vaping status: Never Used  Substance and Sexual Activity   Alcohol use: No   Drug use: No   Sexual activity: Not Currently  Other Topics Concern   Not on file  Social History Narrative   Not on file   Social Drivers of Health   Financial Resource Strain: Not on file  Food Insecurity: No Food Insecurity (03/25/2023)   Hunger Vital Sign    Worried About Running Out of Food in the Last Year: Never true    Ran Out of Food in the Last Year: Never true  Transportation Needs: No Transportation Needs (03/25/2023)   PRAPARE - Transportation  Lack of Transportation (Medical): No    Lack of Transportation (Non-Medical): No  Physical Activity: Not on file  Stress: Not on file  Social Connections: Unknown (03/25/2023)   Social Connection and Isolation Panel [NHANES]    Frequency of Communication with Friends and Family: More than three times a week    Frequency of Social Gatherings with Friends and Family: Twice a week    Attends Religious Services: 1 to 4 times per year    Active Member of [employer] or Organizations: No    Attends Engineer, structural: Never    Marital Status: Patient declined   Family History  Problem Relation Age of Onset   Dementia Sister    Cancer Sister         Not sure which type of cancer   Allergies  Allergen Reactions   Chocolate     HA   Prior to Admission medications   Medication Sig Start Date End Date Taking? Authorizing Provider  alum & mag hydroxide-simeth (MAALOX/MYLANTA) 200-200-20 MG/5ML suspension Take by mouth every 6 (six) hours as needed for indigestion or heartburn.   Yes [provider]  Artificial Saliva (BIOTENE DRY MOUTH MOISTURIZING) SOLN Take 2 sprays by mouth 6 (six) times daily. AS NEEDED 09/07/19  Yes [provider]  aspirin EC 81 MG tablet Take 81 mg by mouth at bedtime. pm   Yes [provider]  cholecalciferol (VITAMIN D) 1000 units tablet Take 1,000 Units by mouth daily.   Yes [provider]  cyanocobalamin (VITAMIN B12) 1000 MCG tablet Take 1,000 mcg by mouth daily. 02/27/23  Yes [provider]  diltiazem (CARDIZEM) 30 MG tablet Take 30 mg by mouth 2 (two) times daily.   Yes [provider]  escitalopram (LEXAPRO) 5 MG tablet Take 5 mg by mouth daily. 02/27/23  Yes [provider]  ketotifen (ZADITOR) 0.025 % ophthalmic solution Place 1 drop into both eyes 2 (two) times daily as needed for dry eyes. 09/27/19  Yes [provider]  meclizine (ANTIVERT) 25 MG tablet Take 25 mg by mouth 3 (three) times daily as needed for dizziness.   Yes [provider]  Melatonin 5 MG TABS Take 2 tablets by mouth at bedtime.    Yes [provider]  metoprolol succinate (TOPROL-XL) 50 MG 24 hr tablet Take 50 mg by mouth daily.   Yes [provider]  Multiple Vitamins-Minerals (CENTRUM SILVER 50+WOMEN PO) Take by mouth.   Yes [provider]  nitroGLYCERIN (NITROSTAT) 0.4 MG SL tablet Place 0.4 mg under the tongue every 5 (five) minutes as needed for chest pain.   Yes [provider]  omeprazole (PRILOSEC) 20 MG capsule Take 20 mg by mouth daily.   Yes [provider]  polyethylene glycol (MIRALAX / GLYCOLAX)  packet Take 17 g by mouth daily.   Yes [provider]  senna (SENOKOT) 8.6 MG TABS tablet Take 2 tablets by mouth 2 (two) times daily. 02/27/23  Yes [provider]  traZODone (DESYREL) 50 MG tablet Take 25 mg by mouth at bedtime. 09/27/19  Yes [provider]  vitamin E 400 UNIT capsule Take 400 Units by mouth daily.   Yes [provider]   ECHOCARDIOGRAM COMPLETE Result Date: 03/25/2023    ECHOCARDIOGRAM REPORT   Patient Name:   BRITTENEY AYOTTE Date of Exam: 03/24/2023 Medical Rec #:  [id number]       Height:       63.0 in Accession #:  [id number]      Weight:       142.0 lb Date of Birth:  [birth year]       BSA:          1.672 m Patient Age:    89 years        BP:           109/65 mmHg Patient Gender: F               HR:           63 bpm. Exam Location:  [facility] Procedure: 2D Echo, Cardiac Doppler and Color Doppler (Both Spectral and Color            Flow Doppler were utilized during procedure). Indications:     I21.4 NSTEMI  History:         Patient has no prior history of Echocardiogram examinations.                  COPD, Signs/Symptoms:Dyspnea; Risk Factors:Hypertension and                  Sleep Apnea.  Sonographer:     Daphine Deutscher RDCS Referring Phys:  [id number] Dorian Pod HUDSON Diagnosing Phys: Marcina Millard MD IMPRESSIONS  1. Left ventricular ejection fraction, by estimation, is 60 to 65%. The left ventricle has normal function. The left ventricle has no regional wall motion abnormalities. There is mild left ventricular hypertrophy. Left ventricular diastolic parameters are consistent with Grade I diastolic dysfunction (impaired relaxation).  2. Right ventricular systolic function is normal. The right ventricular size is normal.  3. The mitral valve is normal in structure. Mild mitral valve regurgitation. No evidence of mitral stenosis.  4. The aortic valve is normal in structure. Aortic valve regurgitation is mild. No aortic stenosis is present.  5. The  inferior vena cava is normal in size with greater than 50% respiratory variability, suggesting right atrial pressure of 3 mmHg. FINDINGS  Left Ventricle: Left ventricular ejection fraction, by estimation, is 60 to 65%. The left ventricle has normal function. The left ventricle has no regional wall motion abnormalities. Strain imaging was not performed. The left ventricular internal cavity  size was normal in size. There is mild left ventricular hypertrophy. Left ventricular diastolic parameters are consistent with Grade I diastolic dysfunction (impaired relaxation). Right Ventricle: The right ventricular size is normal. No increase in right ventricular wall thickness. Right ventricular systolic function is normal. Left Atrium: Left atrial size was normal in size. Right Atrium: Right atrial size was normal in size. Pericardium: There is no evidence of pericardial effusion. Mitral Valve: The mitral valve is normal in structure. Mild mitral valve regurgitation. No evidence of mitral valve stenosis. Tricuspid Valve: The tricuspid valve is normal in structure. Tricuspid valve regurgitation is mild . No evidence of tricuspid stenosis. Aortic Valve: The aortic valve is normal in structure. Aortic valve regurgitation is mild. No aortic stenosis is present. Pulmonic Valve: The pulmonic valve was normal in structure. Pulmonic valve regurgitation is not visualized. No evidence of pulmonic stenosis. Aorta: The aortic root is normal in size and structure. Venous: The inferior vena cava is normal in size with greater than 50% respiratory variability, suggesting right atrial pressure of 3 mmHg. IAS/Shunts: No atrial level shunt detected by color flow Doppler. Additional Comments: 3D imaging was not performed.  LEFT VENTRICLE PLAX 2D LVIDd:         4.60 cm Diastology LVIDs:  2.70 cm LV e' medial:    7.18 cm/s LV PW:         0.80 cm LV E/e' medial:  10.0 LV IVS:        0.80 cm LV e' lateral:   10.02 cm/s                         LV E/e' lateral: 7.2  RIGHT VENTRICLE             IVC RV Basal diam:  3.50 cm     IVC diam: 1.30 cm RV S prime:     12.83 cm/s TAPSE (M-mode): 2.6 cm LEFT ATRIUM             Index        RIGHT ATRIUM           Index LA diam:        4.10 cm 2.45 cm/m   RA Area:     13.30 cm LA Vol (A2C):   33.6 ml 20.10 ml/m  RA Volume:   34.30 ml  20.52 ml/m LA Vol (A4C):   28.0 ml 16.75 ml/m LA Biplane Vol: 30.6 ml 18.30 ml/m  AORTIC VALVE LVOT Vmax:   133.00 cm/s LVOT Vmean:  85.450 cm/s LVOT VTI:    0.266 m  AORTA Ao Root diam: 3.40 cm Ao Asc diam:  3.50 cm MITRAL VALVE MV Area (PHT): 3.24 cm     SHUNTS MV Decel Time: 234 msec     Systemic VTI: 0.27 m MV E velocity: 71.80 cm/s MV A velocity: 111.00 cm/s MV E/A ratio:  0.65 Marcina Millard MD Electronically signed by Marcina Millard MD Signature Date/Time: 03/25/2023/1:10:55 PM    Final    DG Chest 2 View Result Date: 03/24/2023 CLINICAL DATA:  Chest pain after fall. EXAM: CHEST - 2 VIEW COMPARISON:  Chest radiograph dated 10/06/2020. FINDINGS: The heart size and mediastinal contours are within normal limits. No focal consolidation, pleural effusion, or pneumothorax. Diffuse osseous demineralization. Moderate compression deformities of the T12 and L1 vertebral bodies are again noted. Age-indeterminate superior endplate compression deformity of the T8 vertebral body. No obvious displaced rib fracture. IMPRESSION: 1. Mild right basilar atelectasis. Otherwise, no acute cardiopulmonary findings. 2. Age-indeterminate superior endplate compression deformity of the T8 vertebral body. 3. Redemonstrated moderate compression deformities of the T12 and L1 vertebral bodies. Electronically Signed   By: Hart Robinsons M.D.   On: 03/24/2023 14:40   DG Knee Complete 4 Views Left Result Date: 03/24/2023 CLINICAL DATA:  Pain after fall.  Swelling EXAM: LEFT KNEE - COMPLETE 4 VIEW COMPARISON:  None Available. FINDINGS: Osteopenia. Moderate joint space loss of the medial  compartment with some small osteophytes. Preserved lateral compartment and patellofemoral joint. No fracture or dislocation. No joint effusion on lateral view. IMPRESSION: Degenerative changes of the medial compartment.  Osteopenia Electronically Signed   By: Karen Kays M.D.   On: 03/24/2023 12:24    Positive ROS: All other systems have been reviewed and were otherwise negative with the exception of those mentioned in the HPI and as above.  Physical Exam: General:  Alert, no acute distress Psychiatric:  Patient is competent for consent with normal mood and affect   Cardiovascular:  No pedal edema Respiratory:  No wheezing, non-labored breathing GI:  Abdomen is soft and non-tender Skin:  No lesions in the area of chief complaint Neurologic:  Sensation intact distally Lymphatic:  No axillary or cervical lymphadenopathy  Orthopedic Exam:  Left lower extremity Skin intact over the knee with some ecchymosis palpable swelling anterior to the patella and diffusely over the anterior knee Able to straight leg raise and compartments soft skin over the anterior patella tender but easily compressible Minimal pain with range of motion from 15 to 75 degrees pain with end range of motion Neurovascular intact distally  X-rays:  X-rays of the left knee reviewed which show degenerative changes to the knee no fractures noted agree with radiology interpretation.  Assessment: Left knee hematoma status post heparin drip and contusion  Plan: I reviewed the indications for intervention with the patient for the hematoma.  We discussed that as it already been over a day we could consider a bedside aspiration patient agreed with attempted aspiration at bedside at this time.  I reviewed the risks and benefits of the procedure including not limited to damage to structures, nerves, vessels, infection, need for further procedures on the knee.  Left knee was cleaned prepped and draped in usual sterile fashion with  chlorhexidine and alcohol an 18-gauge needle was used to attempt an aspiration of the anterior soft tissues above the patella in the area of most swelling.  Thick chunky hematoma was only able to be partially aspirated a few ccs, very little fluid was able to be removed due to the thickness of the hematoma.  A compressive dressing was applied with a bulky Jones and an Ace wrap.  Patient tolerated the procedure well without any complications.  I discussed after this procedure with the patient that at this time point it appears that the hematoma has thickened and her compartments and the skin above her knee is soft so we could proceed with a conservative approach to management with a gentle compressive dressing and close outpatient follow-up.  I do think is reasonable to obtain a CT scan to rule out any occult fracture however I have low clinical suspicion for fracture based on my exam today.  All this was reviewed with the patient she agrees with above plan to get the CT to rule out fracture but otherwise continue conservative management of her hematoma.    Reinaldo Berber MD  Beeper #:  [phone]  03/25/2023 5:53 PM

## 2023-03-25 NOTE — ED Notes (Signed)
 Resp arrives to apply cpap per order

## 2023-03-25 NOTE — ED Notes (Signed)
 Pt reports new onset mid sternal CP radiating to left chest area unknown origin

## 2023-03-25 NOTE — Evaluation (Signed)
 Physical Therapy Evaluation Patient Details Name: Ann Welch MRN: [id number] DOB: 14-Mar-1931 Today's Date: 03/25/2023  History of Present Illness  Patient is a 89 year old female with chest pain, elevated troponin, fall, supraventricular tachycardia. History of paroxysmal SVT, hypertension, COPD, GERD  Clinical Impression  Patient is agreeable to PT evaluation. She is coming from home where she reports she has an aide and family help. She is ambulatory with cane or rolling walker. Recent fall.  Today the patient complains of L knee pain. She was able to stand for around 90 seconds with steadying assistance provided using the rolling walker. Activity tolerance limited by fatigue. Orthostatic vitals taken during mobility. Anticipate the patient will require initial assistance with mobility if going home. Recommend to continue PT to maximize independence and facilitate return to prior level of function.   Orthostatic vitals taken during PT session:  BP- Lying Pulse- Lying BP- Sitting Pulse- Sitting BP- Standing at 0 minutes Pulse- Standing at 0 minutes  03/25/23 1040 (!) 87/73 89 137/78 78 116/76 84        If plan is discharge home, recommend the following: A little help with bathing/dressing/bathroom;Assistance with cooking/housework;Help with stairs or ramp for entrance;Assist for transportation   Can travel by private vehicle   Yes    Equipment Recommendations None recommended by PT  Recommendations for Other Services       Functional Status Assessment Patient has had a recent decline in their functional status and demonstrates the ability to make significant improvements in function in a reasonable and predictable amount of time.     Precautions / Restrictions Precautions Precautions: Fall Restrictions Weight Bearing Restrictions Per Provider Order: No      Mobility  Bed Mobility Overal bed mobility: Needs Assistance Bed Mobility: Supine to Sit, Sit to Supine      Supine to sit: Min assist Sit to supine: Min assist   General bed mobility comments: assistance for trunk to sit upright. assistance for LLE to return to bed    Transfers Overall transfer level: Needs assistance Equipment used: Rolling walker (2 wheels) Transfers: Sit to/from Stand Sit to Stand: Min assist           General transfer comment: light lifting assistance for standing    Ambulation/Gait               General Gait Details: not attempted as patient reports pain in the L knee as well as mild dizziness  Stairs            Wheelchair Mobility     Tilt Bed    Modified Rankin (Stroke Patients Only)       Balance Overall balance assessment: Needs assistance, History of Falls Sitting-balance support: Feet supported Sitting balance-Leahy Scale: Fair     Standing balance support: Bilateral upper extremity supported Standing balance-Leahy Scale: Poor Standing balance comment: external support required. standing tolerance of around 90 seconds                             Pertinent Vitals/Pain Pain Assessment Pain Assessment: Faces Faces Pain Scale: Hurts even more Pain Location: L knee Pain Descriptors / Indicators: Discomfort Pain Intervention(s): Monitored during session, Limited activity within patient's tolerance, Repositioned    Home Living Family/patient expects to be discharged to:: Private residence Living Arrangements: Alone Available Help at Discharge: Family;Available PRN/intermittently Type of Home: Apartment Home Access: Level entry       Home Layout:  One level Home Equipment: Agricultural consultant (2 wheels);Cane - single point      Prior Function Prior Level of Function : Independent/Modified Independent             Mobility Comments: ambulation with cane/rolling walker ADLs Comments: patient reports someone from [facility] comes in the home to assist her.     Extremity/Trunk Assessment   Upper Extremity  Assessment Upper Extremity Assessment: Overall WFL for tasks assessed    Lower Extremity Assessment Lower Extremity Assessment: LLE deficits/detail;Generalized weakness LLE Deficits / Details: painful and swollen left knee. guarding with movement at time LLE: Unable to fully assess due to pain       Communication   Communication Communication: No apparent difficulties    Cognition Arousal: Alert Behavior During Therapy: WFL for tasks assessed/performed                           PT - Cognition Comments: patient can speak English and follow all commands in [place] without difficulty Following commands: Intact       Cueing Cueing Techniques: Verbal cues     General Comments General comments (skin integrity, edema, etc.): orthostatic vitals taken during session    Exercises     Assessment/Plan    PT Assessment Patient needs continued PT services  PT Problem List Decreased strength;Decreased range of motion;Decreased activity tolerance;Decreased balance;Decreased mobility;Decreased safety awareness;Pain       PT Treatment Interventions DME instruction;Gait training;Stair training;Functional mobility training;Therapeutic activities;Therapeutic exercise;Balance training;Neuromuscular re-education;Cognitive remediation;Patient/family education    PT Goals (Current goals can be found in the Care Plan section)  Acute Rehab PT Goals Patient Stated Goal: to feel better PT Goal Formulation: With patient Time For Goal Achievement: 04/08/23 Potential to Achieve Goals: Fair    Frequency Min 1X/week     Co-evaluation               AM-PAC PT "6 Clicks" Mobility  Outcome Measure Help needed turning from your back to your side while in a flat bed without using bedrails?: None Help needed moving from lying on your back to sitting on the side of a flat bed without using bedrails?: A Little Help needed moving to and from a bed to a chair (including a wheelchair)?:  A Little Help needed standing up from a chair using your arms (e.g., wheelchair or bedside chair)?: A Little Help needed to walk in hospital room?: A Little Help needed climbing 3-5 steps with a railing? : A Lot 6 Click Score: 18    End of Session   Activity Tolerance: Patient limited by fatigue Patient left: in bed;with call bell/phone within reach;with bed alarm set   PT Visit Diagnosis: Muscle weakness (generalized) (M62.81);Unsteadiness on feet (R26.81)    Time: 9629-5284 PT Time Calculation (min) (ACUTE ONLY): 19 min   Charges:   PT Evaluation $PT Eval Low Complexity: 1 Low PT Treatments $Therapeutic Activity: 8-22 mins PT General Charges $$ ACUTE PT VISIT: 1 Visit         Donna Bernard, PT, MPT   Ina Homes 03/25/2023, 12:48 PM

## 2023-03-25 NOTE — ED Notes (Signed)
 Pt noted to have decreased BP Charge RN notified for assistance 2nd PIV initiated manual BP obtained

## 2023-03-25 NOTE — ED Notes (Signed)
Non-skid socks applied

## 2023-03-25 NOTE — ED Notes (Signed)
 Manual amd monitor BP obtained Rnx2 @ bs pt remains in trendelenburg position pt continues to open eyes to voice and respond appropriately  fluid bolus continues NP paged for assistance

## 2023-03-25 NOTE — Progress Notes (Addendum)
 [facility] CLINIC CARDIOLOGY PROGRESS NOTE       Patient ID: Ann Welch MRN: [id number] DOB/AGE: April 02, 1931 89 y.o.  Admit date: 03/24/2023 Referring Physician Dr. Gillis Santa [facility]  Primary Cardiologist Dr. Lady Gary (last seen 2018) Reason for Consultation chest pain, elevated troponin  HPI: Ann Welch is a 89 y.o. female  with a past medical history of paroxysmal SVT, hypertension, COPD, GERD who presented to the ED on 03/24/2023 for fall.  She reportedly had also told her PCP earlier today that she had chest pain, troponin was checked and was found to be elevated.  Cardiology was consulted for further evaluation.   History: -Patient seen and examined this morning.  Resting in ED stretcher. -She is endorsing some nausea this morning.  States that she did have an episode of chest discomfort earlier this morning but this is since resolved. -Did receive nitroglycerin for her chest pain episode which caused hypotension, she received a fluid bolus.  BP now stable. -No evidence of SVT episodes on telemetry.  Review of systems complete and found to be negative unless listed above    Past Medical History:  Diagnosis Date   Anginal pain (HCC)    Anxiety    Arthritis    COPD (chronic obstructive pulmonary disease) (HCC)    Dyspnea    Dysrhythmia    Hx of A-Fib   GERD (gastroesophageal reflux disease)    Headache    1/week   History of recent hospitalization 05/05/2017   In [place] for low BP and angina with Heart Cath, no significant findings, Surgical clearance in chart [facility] (hard of hearing)    right ear   Hypertension    Liver mass    Meniere disease    Orthopnea    Restless leg syndrome    Sleep apnea    CPAP   Wears dentures    upper    Past Surgical History:  Procedure Laterality Date   BREAST BIOPSY     CARDIAC CATHETERIZATION     CATARACT EXTRACTION W/PHACO Right  07/05/2017   Procedure: CATARACT EXTRACTION PHACO AND INTRAOCULAR LENS PLACEMENT (IOC) RIGHT;  Surgeon: Lockie Mola, MD;  Location: [facility];  Service: Ophthalmology;  Laterality: Right;  Sleep Apnea-CPAP Spanish speaking-understands English   CATARACT EXTRACTION W/PHACO Left 10/06/2017   Procedure: CATARACT EXTRACTION PHACO AND INTRAOCULAR LENS PLACEMENT (IOC)  LEFT PRE DIABETIC;  Surgeon: Lockie Mola, MD;  Location: [facility];  Service: Ophthalmology;  Laterality: Left;   COLONOSCOPY     COLONOSCOPY WITH PROPOFOL N/A 10/10/2019   Procedure: COLONOSCOPY WITH PROPOFOL;  Surgeon: [name], MD;  Location: [facility];  Service: Gastroenterology;  Laterality: N/A;    (Not in a hospital admission)  Social History   Socioeconomic History   Marital status: Married    Spouse name: Not on file   Number of children: Not on file   Years of education: Not on file   Highest education level: Not on file  Occupational History   Not on file  Tobacco Use   Smoking status: Never   Smokeless tobacco: Never  Vaping Use   Vaping status: Never Used  Substance and Sexual Activity   Alcohol use: No   Drug use: No   Sexual activity: Not Currently  Other Topics Concern   Not on file  Social History Narrative   Not on file   Social Drivers of Health  Financial Resource Strain: Not on file  Food Insecurity: Not on file  Transportation Needs: Not on file  Physical Activity: Not on file  Stress: Not on file  Social Connections: Not on file  Intimate Partner Violence: Not on file    Family History  Problem Relation Age of Onset   Dementia Sister    Cancer Sister        Not sure which type of cancer     Vitals:   03/25/23 0653 03/25/23 0655 03/25/23 0656 03/25/23 0932  BP: (!) 117/58   113/64  Pulse: 62 70 64 81  Resp: 17 17 16 19   Temp:    98 F (36.7 C)  TempSrc:    Oral  SpO2: 98% 100% 100% 100%  Weight:      Height:        PHYSICAL  EXAM General: Well-appearing elderly female, well nourished, in no acute distress. HEENT: Normocephalic and atraumatic. Neck: No JVD.  Lungs: Normal respiratory effort on room air. Clear bilaterally to auscultation. No wheezes, crackles, rhonchi.  Heart: HRRR. Normal S1 and S2 without gallops or murmurs.  Abdomen: Non-distended appearing.  Msk: Normal strength and tone for age. Extremities: Warm and well perfused. No clubbing, cyanosis.  No edema.  L knee with ecchymosis. Neuro: Alert and oriented X 3. Psych: Answers questions appropriately.   Labs: Basic Metabolic Panel: Recent Labs    03/24/23 1251 03/24/23 1446 03/25/23 0435  NA 130*  --  132*  K 4.1  --  4.2  CL 97*  --  101  CO2 23  --  23  GLUCOSE 120*  --  108*  BUN 16  --  11  CREATININE 0.68  --  0.65  CALCIUM 9.4  --  8.9  MG  --  2.3 2.1  PHOS  --  3.7 3.4   Liver Function Tests: No results for input(s): "AST", "ALT", "ALKPHOS", "BILITOT", "PROT", "ALBUMIN" in the last 72 hours. No results for input(s): "LIPASE", "AMYLASE" in the last 72 hours. CBC: Recent Labs    03/24/23 1251 03/25/23 0435  WBC 9.2 7.7  HGB 12.7 11.1*  HCT 38.4 33.6*  MCV 93.0 92.1  PLT 170 148*   Cardiac Enzymes: Recent Labs    03/24/23 1446 03/24/23 1726 03/24/23 2040  TROPONINIHS 1,073* 1,137* 929*   BNP: No results for input(s): "BNP" in the last 72 hours. D-Dimer: No results for input(s): "DDIMER" in the last 72 hours. Hemoglobin A1C: No results for input(s): "HGBA1C" in the last 72 hours. Fasting Lipid Panel: No results for input(s): "CHOL", "HDL", "LDLCALC", "TRIG", "CHOLHDL", "LDLDIRECT" in the last 72 hours. Thyroid Function Tests: Recent Labs    03/24/23 1446  TSH 0.662   Anemia Panel: No results for input(s): "VITAMINB12", "FOLATE", "FERRITIN", "TIBC", "IRON", "RETICCTPCT" in the last 72 hours.   Radiology: DG Chest 2 View Result Date: 03/24/2023 CLINICAL DATA:  Chest pain after fall. EXAM: CHEST - 2 VIEW  COMPARISON:  Chest radiograph dated 10/06/2020. FINDINGS: The heart size and mediastinal contours are within normal limits. No focal consolidation, pleural effusion, or pneumothorax. Diffuse osseous demineralization. Moderate compression deformities of the T12 and L1 vertebral bodies are again noted. Age-indeterminate superior endplate compression deformity of the T8 vertebral body. No obvious displaced rib fracture. IMPRESSION: 1. Mild right basilar atelectasis. Otherwise, no acute cardiopulmonary findings. 2. Age-indeterminate superior endplate compression deformity of the T8 vertebral body. 3. Redemonstrated moderate compression deformities of the T12 and L1 vertebral bodies. Electronically Signed  By: Hart Robinsons M.D.   On: 03/24/2023 14:40   DG Knee Complete 4 Views Left Result Date: 03/24/2023 CLINICAL DATA:  Pain after fall.  Swelling EXAM: LEFT KNEE - COMPLETE 4 VIEW COMPARISON:  None Available. FINDINGS: Osteopenia. Moderate joint space loss of the medial compartment with some small osteophytes. Preserved lateral compartment and patellofemoral joint. No fracture or dislocation. No joint effusion on lateral view. IMPRESSION: Degenerative changes of the medial compartment.  Osteopenia Electronically Signed   By: Karen Kays M.D.   On: 03/24/2023 12:24    ECHO ordered  TELEMETRY reviewed by me 03/25/2023: Sinus rhythm rate 80s  EKG reviewed by me: Sinus rhythm rate 66 bpm  Data reviewed by me 03/25/2023: last 24h vitals tele labs imaging I/O hospitalist progress note  Principal Problem:   NSTEMI (non-ST elevated myocardial infarction) (HCC)    ASSESSMENT AND PLAN:  Ann Welch is a 89 y.o. female  with a past medical history of paroxysmal SVT, hypertension, COPD, GERD who presented to the ED on 03/24/2023 for fall.  She reportedly had also told her PCP earlier today that she had chest pain, troponin was checked and was found to be elevated.  Cardiology was consulted for further  evaluation.   # Chest pain # Elevated troponin # Fall Patient had onset of chest pain and dizziness this morning with associated elevated heart rate.  Had a fall at her facility following symptom onset.  EMS was called and she was brought to the ED for further evaluation.  Troponins 883 > 1073 > 1137 > 929.  Other workup relatively unremarkable.  Had LHC 2019 at hospital in [place] after similar presentation which revealed essentially normal coronaries. -Echo pending -Discontinue heparin. -Elevated troponin most consistent with demand/supply mismatch and not ACS in the setting of SVT.  # Supraventricular tachycardia Patient with history of SVT episodes for many years.  Wore Zio monitor 01/2021 which revealed occasional SVT episodes. -Continue home metoprolol succinate 25 mg daily. -Plan for monitor on discharge for further evaluation of SVT.  Patient will likely be stable for discharge later this afternoon pending results of echocardiogram.  Monitor to be placed today.  ADDENDUM: Echo with preserved EF, no WMAs. Monitor in place. Primary team working up knee injury further. Will sign off and plan to see patient in clinic in the coming weeks.   This patient's plan of care was discussed and created with Dr. Darrold Junker and he is in agreement.  Signed: Gale Journey, PA-C  03/25/2023, 10:50 AM [facility] Cardiology

## 2023-03-25 NOTE — Progress Notes (Signed)
       CROSS COVER NOTE  NAME: Ann Welch MRN: [id number] DOB : [birth year] ATTENDING PHYSICIAN: Gillis Santa, MD    Date of Service   03/25/2023   HPI/Events of Note   Informed by nursing patietn complained of chest pain, was given nitroglycerin SL  that dropped her blood pressure. A liter of NS hung as bolus, BP improved  Interventions   Assessment/Plan: Pr nursing chest pain and hypotwension resolved 1 continue to monitor on tele       Donnie Mesa NP [facility] Cross Cover 7pm-7am - check amion for availability Pager [phone]

## 2023-03-25 NOTE — Evaluation (Signed)
 Occupational Therapy Evaluation Patient Details Name: Ann Welch MRN: [id number] DOB: [birth year] Today's Date: 03/25/2023   History of Present Illness   89 y.o. female with PMH of paroxysmal A-fib, HTN, COPD, Mnire's disease, hard of hearing, anxiety, RLS, OSA, OA, GERD, as reviewed from EMR, presented to [facility] ED from senior housing with complaining of chest pain woke her up from the sleep.  Patient noticed heart rate in 150s and she took Cardizem and then she took aspirin.  Patient felt dizzy and fell hit her left knee, able to ambulate does not feel she had fracture.  She denied any chest pain.  Patient was brought in to the ED by EMS, physician from the facility mentioned that patient had chest pain and needed a chest pain workup.     Clinical Impressions Patient presenting with decreased Ind in self care, balance, functional mobility/transfers,endurance, and safety awareness. Patient reports living in apartment alone at mod I level for mobility and self care tasks. Children assist her with appointments and getting groceries. Pt with recent fall reporting increased pain and noted edema of L knee during session. She declined OOB tasks but able to demonstrate bed mobility with min A but fatigues quickly.  Patient will benefit from acute OT to increase overall independence in the areas of ADLs, functional mobility, and safety awareness in order to safely discharge.     If plan is discharge home, recommend the following:   A lot of help with bathing/dressing/bathroom;A lot of help with walking and/or transfers;Assistance with cooking/housework;Assist for transportation     Functional Status Assessment   Patient has had a recent decline in their functional status and demonstrates the ability to make significant improvements in function in a reasonable and predictable amount of time.     Equipment Recommendations   Other (comment) (defer to next venue of care)       Precautions/Restrictions   Precautions Precautions: Fall Restrictions Weight Bearing Restrictions Per Provider Order: No            ADL either performed or assessed with clinical judgement   ADL Overall ADL's : Needs assistance/impaired                                       General ADL Comments: set up A for UB self care tasks. Pt able to bridge with min A for repositioning in bed. She declined EOB and functional transfer secondary to L knee pain and edema.     Vision Patient Visual Report: No change from baseline              Pertinent Vitals/Pain Pain Assessment Pain Assessment: 0-10 Pain Score: 8  Pain Location: L knee Pain Descriptors / Indicators: Aching, Discomfort Pain Intervention(s): Limited activity within patient's tolerance, Monitored during session, Premedicated before session, Repositioned     Extremity/Trunk Assessment Upper Extremity Assessment Upper Extremity Assessment: Overall WFL for tasks assessed;Generalized weakness   Lower Extremity Assessment Lower Extremity Assessment: LLE deficits/detail LLE: Unable to fully assess due to pain       Communication Communication Communication: No apparent difficulties   Cognition Arousal: Alert Behavior During Therapy: WFL for tasks assessed/performed Cognition: No apparent impairments                               Following commands: Intact  Cueing   Cueing Techniques: Verbal cues              Home Living Family/patient expects to be discharged to:: Private residence Living Arrangements: Alone Available Help at Discharge: Family;Available PRN/intermittently Type of Home: Apartment Home Access: Level entry     Home Layout: One level     Bathroom Shower/Tub: Sponge bathes at baseline   Bathroom Toilet: Handicapped height     Home Equipment: Agricultural consultant (2 wheels);Cane - single point          Prior Functioning/Environment Prior  Level of Function : Independent/Modified Independent               ADLs Comments: Pt reports performing self care at mod I level with use of cane and RW as needed. Family takes pt to appointments and brings groceries.    OT Problem List: Decreased strength;Decreased activity tolerance;Decreased safety awareness;Impaired balance (sitting and/or standing);Decreased knowledge of use of DME or AE   OT Treatment/Interventions: Self-care/ADL training;Therapeutic exercise;Therapeutic activities;Energy conservation;Balance training;DME and/or AE instruction;Patient/family education      OT Goals(Current goals can be found in the care plan section)   Acute Rehab OT Goals Patient Stated Goal: to decrease pain OT Goal Formulation: With patient Time For Goal Achievement: 04/08/23 Potential to Achieve Goals: Fair ADL Goals Pt Will Perform Grooming: with supervision Pt Will Perform Lower Body Dressing: with supervision;sit to/from stand Pt Will Transfer to Toilet: with supervision;ambulating Pt Will Perform Toileting - Clothing Manipulation and hygiene: with supervision;sit to/from stand   OT Frequency:  Min 2X/week       AM-PAC OT "6 Clicks" Daily Activity     Outcome Measure Help from another person eating meals?: None Help from another person taking care of personal grooming?: A Little Help from another person toileting, which includes using toliet, bedpan, or urinal?: A Lot Help from another person bathing (including washing, rinsing, drying)?: A Little Help from another person to put on and taking off regular upper body clothing?: A Little Help from another person to put on and taking off regular lower body clothing?: A Lot 6 Click Score: 17   End of Session Nurse Communication: Mobility status  Activity Tolerance: Patient limited by pain Patient left: in bed;with call bell/phone within reach;with bed alarm set  OT Visit Diagnosis: Unsteadiness on feet (R26.81);Repeated falls  (R29.6);Muscle weakness (generalized) (M62.81)                Time: 1610-9604 OT Time Calculation (min): 15 min Charges:  OT General Charges $OT Visit: 1 Visit OT Evaluation $OT Eval Low Complexity: 1 Low  Jackquline Denmark, MS, OTR/L , CBIS ascom [phone]  03/25/23, 11:11 AM

## 2023-03-25 NOTE — ED Notes (Signed)
 1L .9 NS bolus initiated no current Hx of CHF Rnx2 @ bs pt placed in trendelenburg position pt opens eyes to voice responds appropriately to inquiry

## 2023-03-26 DIAGNOSIS — I214 Non-ST elevation (NSTEMI) myocardial infarction: Secondary | ICD-10-CM | POA: Diagnosis not present

## 2023-03-26 LAB — CBC
HCT: 31.3 % — ABNORMAL LOW (ref 36.0–46.0)
Hemoglobin: 10.5 g/dL — ABNORMAL LOW (ref 12.0–15.0)
MCH: 30.9 pg (ref 26.0–34.0)
MCHC: 33.5 g/dL (ref 30.0–36.0)
MCV: 92.1 fL (ref 80.0–100.0)
Platelets: 146 10*3/uL — ABNORMAL LOW (ref 150–400)
RBC: 3.4 MIL/uL — ABNORMAL LOW (ref 3.87–5.11)
RDW: 13.1 % (ref 11.5–15.5)
WBC: 7.8 10*3/uL (ref 4.0–10.5)
nRBC: 0 % (ref 0.0–0.2)

## 2023-03-26 LAB — BASIC METABOLIC PANEL
Anion gap: 7 (ref 5–15)
BUN: 8 mg/dL (ref 8–23)
CO2: 22 mmol/L (ref 22–32)
Calcium: 8.6 mg/dL — ABNORMAL LOW (ref 8.9–10.3)
Chloride: 102 mmol/L (ref 98–111)
Creatinine, Ser: 0.5 mg/dL (ref 0.44–1.00)
GFR, Estimated: >60 mL/min (ref >60)
Glucose, Bld: 108 mg/dL — ABNORMAL HIGH (ref 70–99)
Potassium: 3.5 mmol/L (ref 3.5–5.1)
Sodium: 131 mmol/L — ABNORMAL LOW (ref 135–145)

## 2023-03-26 LAB — PHOSPHORUS: Phosphorus: 2.8 mg/dL (ref 2.5–4.6)

## 2023-03-26 LAB — MAGNESIUM: Magnesium: 2 mg/dL (ref 1.7–2.4)

## 2023-03-26 MED ORDER — DILTIAZEM HCL 25 MG/5ML IV SOLN
10.0000 mg | Freq: Once | INTRAVENOUS | Status: AC
  Administered 2023-03-26: 10 mg via INTRAVENOUS

## 2023-03-26 MED ORDER — ALUM & MAG HYDROXIDE-SIMETH 200-200-20 MG/5ML PO SUSP
15.0000 mL | ORAL | Status: DC | PRN
  Administered 2023-03-26 – 2023-03-27 (×2): 15 mL via ORAL
  Filled 2023-03-26 (×2): qty 30

## 2023-03-26 NOTE — Progress Notes (Signed)
 Occupational Therapy Treatment Patient Details Name: Ann Welch MRN: [id number] DOB: January 31, 1931 Today's Date: 03/26/2023   History of present illness Patient is a 89 year old female with chest pain, elevated troponin, fall, supraventricular tachycardia. History of paroxysmal SVT, hypertension, COPD, GERD   OT comments  Upon entering the room, pt supine in bed and agreeable to OT intervention. Pt performs supine to sit with min guard. Pt ambulates with RW with CGA into bathroom for toileting. Pt able to void and needs min guard for balance with clothing management. Pt stands at sink and performs hand hygiene with supervision. Pt declined sitting in recliner chair and returns to bed at end of session.       If plan is discharge home, recommend the following:  A lot of help with bathing/dressing/bathroom;A lot of help with walking and/or transfers;Assistance with cooking/housework;Assist for transportation   Equipment Recommendations  Other (comment) (defer to next venue of care)       Precautions / Restrictions Precautions Precautions: Fall       Mobility Bed Mobility Overal bed mobility: Needs Assistance Bed Mobility: Supine to Sit, Sit to Supine     Supine to sit: Min assist Sit to supine: Min assist        Transfers Overall transfer level: Needs assistance Equipment used: Rolling walker (2 wheels) Transfers: Sit to/from Stand Sit to Stand: Min assist, Contact guard assist                 Balance Overall balance assessment: Needs assistance, History of Falls Sitting-balance support: Feet supported Sitting balance-Leahy Scale: Fair     Standing balance support: Bilateral upper extremity supported Standing balance-Leahy Scale: Poor                             ADL either performed or assessed with clinical judgement   ADL Overall ADL's : Needs assistance/impaired                         Toilet Transfer: Contact guard assist;Regular  Toilet;Rolling walker (2 wheels)   Toileting- Clothing Manipulation and Hygiene: Contact guard assist              Extremity/Trunk Assessment Upper Extremity Assessment Upper Extremity Assessment: Overall WFL for tasks assessed            Vision Patient Visual Report: No change from baseline           Communication Communication Communication: No apparent difficulties   Cognition Arousal: Alert Behavior During Therapy: WFL for tasks assessed/performed Cognition: No apparent impairments                               Following commands: Intact        Cueing   Cueing Techniques: Verbal cues             Pertinent Vitals/ Pain       Pain Assessment Pain Assessment: 0-10 Pain Score: 4  Pain Location: L knee Pain Descriptors / Indicators: Discomfort Pain Intervention(s): Monitored during session, Limited activity within patient's tolerance, Repositioned         Frequency  Min 1X/week        Progress Toward Goals  OT Goals(current goals can now be found in the care plan section)  Progress towards OT goals: Progressing toward goals      AM-PAC  OT "6 Clicks" Daily Activity     Outcome Measure   Help from another person eating meals?: None Help from another person taking care of personal grooming?: A Little Help from another person toileting, which includes using toliet, bedpan, or urinal?: A Little Help from another person bathing (including washing, rinsing, drying)?: A Little Help from another person to put on and taking off regular upper body clothing?: A Little Help from another person to put on and taking off regular lower body clothing?: A Little 6 Click Score: 19    End of Session Equipment Utilized During Treatment: Rolling walker (2 wheels)  OT Visit Diagnosis: Unsteadiness on feet (R26.81);Repeated falls (R29.6);Muscle weakness (generalized) (M62.81)   Activity Tolerance Patient limited by pain   Patient Left in bed;with  call bell/phone within reach;with bed alarm set   Nurse Communication Mobility status        Time: 1610-9604 OT Time Calculation (min): 15 min  Charges: OT General Charges $OT Visit: 1 Visit OT Treatments $Self Care/Home Management : 8-22 mins  Jackquline Denmark, MS, OTR/L , CBIS ascom [phone]  03/26/23, 2:27 PM

## 2023-03-26 NOTE — TOC Initial Note (Addendum)
 Transition of Care [facility]) - Initial/Assessment Note    Patient Details  Name: Ann Welch MRN: [id number] Date of Birth: [birth year]  Transition of Care [facility]) CM/SW Contact:    Liliana Cline, LCSW Phone Number: 03/26/2023, 11:30 AM  Clinical Narrative:                 CSW met with patient at bedside. Patient declined using an interpreter.  Patient is a [facility] member. She states she goes to the center 3 days per week, and has an aide through [facility] that comes for two hours Monday, Wednesday, and Friday. Patient lives alone but her son checks on her daily. Patient states she uses a walker at home.  CSW explained recommendations for SNF for STR. Patient states she does not want SNF, would prefer to return home with continued [facility] services and feels comfortable doing so. Patient states her son may be able to transport her home if not at work, but if he can't she would like [facility] to transport her.  CSW then called Jeanna at [facility]. Informed Charisse March of patient refusing SNF. Charisse March will update the team. Charisse March states it is better for them if patient discharges Friday or Monday since their Pharmacy is closed on the weekends. Charisse March states if patient discharges on a weekday, [facility] can transport if needed.  12:05- Left VM for son requesting call so CSW can update him per MD request.  12:20- Spoke with son Channing Mutters, he is aware patient has refused SNF and is ok with this. He states he can pick patient up tomorrow any time after noon. Called Jeanna at [facility] back about prescriptions. Jeanna requests prescriptions be sent today and they will deliver them to the hospital so they can be sent home with patient tomorrow. Asked MD to send prescriptions.  12:50- Per MD, patient won't have any medication changes. Mady Gemma at [facility] and plan for DC tomorrow, son to transport. If any changes- Charisse March states to call main [facility] number for on call provider to give an update.   Expected Discharge Plan: Home w Home Health  Services Barriers to Discharge: Continued Medical Work up   Patient Goals and CMS Choice Patient states their goals for this hospitalization and ongoing recovery are:: declined SNF CMS Medicare.gov Compare Post Acute Care list provided to:: Patient Choice offered to / list presented to : Patient      Expected Discharge Plan and Services       Living arrangements for the past 2 months: Single Family Home                                      Prior Living Arrangements/Services Living arrangements for the past 2 months: Single Family Home Lives with:: Self Patient language and need for interpreter reviewed:: Yes Do you feel safe going back to the place where you live?: Yes      Need for Family Participation in Patient Care: Yes (Comment) Care giver support system in place?: Yes (comment) Current home services: DME, Homehealth aide Criminal Activity/Legal Involvement Pertinent to Current Situation/Hospitalization: No - Comment as needed  Activities of Daily Living   ADL Screening (condition at time of admission) Independently performs ADLs?: No Does the patient have a NEW difficulty with bathing/dressing/toileting/self-feeding that is expected to last >3 days?: No Does the patient have a NEW difficulty with getting in/out of bed, walking, or climbing stairs that is  expected to last >3 days?: No Does the patient have a NEW difficulty with communication that is expected to last >3 days?: No Is the patient deaf or have difficulty hearing?: No Does the patient have difficulty seeing, even when wearing glasses/contacts?: No Does the patient have difficulty concentrating, remembering, or making decisions?: No  Permission Sought/Granted Permission sought to share information with : Facility Industrial/product designer granted to share information with : Yes, Verbal Permission Granted     Permission granted to share info w AGENCY: [facility]        Emotional  Assessment       Orientation: : Oriented to Self, Oriented to Place, Oriented to  Time, Oriented to Situation Alcohol / Substance Use: Not Applicable Psych Involvement: No (comment)  Admission diagnosis:  Hyponatremia [E87.1] NSTEMI (non-ST elevated myocardial infarction) (HCC) [I21.4] Contusion of left knee, initial encounter [S80.02XA] Patient Active Problem List   Diagnosis Date Noted   Contusion of left knee 03/25/2023   NSTEMI (non-ST elevated myocardial infarction) (HCC) 03/24/2023   GIB (gastrointestinal bleeding) 10/09/2019   Atrial fibrillation, chronic (HCC) 10/09/2019   COPD (chronic obstructive pulmonary disease) (HCC)    GERD (gastroesophageal reflux disease)    Hypertension    Lesion of endometrium    Chest pain 12/29/2017   PCP:  [facility] Pharmacy:   [facility] [place], [place] - [address] [place] [address] Phone: [phone] Fax: [phone]     Social Drivers of Health (SDOH) Social History: SDOH Screenings   Food Insecurity: No Food Insecurity (03/25/2023)  Housing: Low Risk  (03/25/2023)  Transportation Needs: No Transportation Needs (03/25/2023)  Utilities: Not At Risk (03/25/2023)  Social Connections: Unknown (03/25/2023)  Tobacco Use: Low Risk  (03/25/2023)   SDOH Interventions:     Readmission Risk Interventions    03/26/2023   11:29 AM  Readmission Risk Prevention Plan  Transportation Screening Complete  PCP or Specialist Appt within 5-7 Days Complete  Home Care Screening Complete  Medication Review (RN CM) Complete

## 2023-03-26 NOTE — Progress Notes (Signed)
 Pt called me to room c/o "heart racing" said she felt indigenstion since she finsiehd her dinner. All vitals were WNL except heart rate was 150. Dr. Lucianne Muss notified and ordered IV Cardizem x1 and I gave it. Her heart rate came right down to 80. Pt thanked me.

## 2023-03-26 NOTE — Progress Notes (Signed)
 Subjective: Patient seen and examined at bedside she reports that her left knee pain is improved from yesterday then feels less swollen and she is able to move it better.  She denies any drainage or issues after the aspiration yesterday.  The compressive dressing is stayed on without any issues.  Denies any numbness or tingling to the lower extremity.   Objective: Vital signs in last 24 hours: Temp:  [97.6 F (36.4 C)-98.1 F (36.7 C)] 98 F (36.7 C) (02/28 1605) Pulse Rate:  [67-86] 71 (02/28 1605) Resp:  [16-19] 16 (02/28 0743) BP: (113-152)/(69-79) 113/79 (02/28 1605) SpO2:  [95 %-100 %] 97 % (02/28 1605)  Intake/Output from previous day: 02/27 0701 - 02/28 0700 In: 777 [P.O.:777] Out: 800 [Urine:800] Intake/Output this shift: Total I/O In: 480 [P.O.:480] Out: -   Recent Labs    03/24/23 1251 03/25/23 0435 03/26/23 0514  HGB 12.7 11.1* 10.5*   Recent Labs    03/25/23 0435 03/26/23 0514  WBC 7.7 7.8  RBC 3.65* 3.40*  HCT 33.6* 31.3*  PLT 148* 146*   Recent Labs    03/25/23 0435 03/26/23 0514  NA 132* 131*  K 4.2 3.5  CL 101 102  CO2 23 22  BUN 11 8  CREATININE 0.65 0.50  GLUCOSE 108* 108*  CALCIUM 8.9 8.6*   Recent Labs    03/24/23 1446  INR 1.0    Physical Exam: Left lower extremity Skin intact over the knee dressing removed Hematoma found to have decreased in size with softer skin over the anterior knee and a smaller size of the knee overall. Range of motion is improved with less pain and less discomfort for the patient Prior attempted aspiration site has closed and there is no active bleeding Neurovascular intact distally with soft compartments able to dorsiflex and plantarflex the foot and toes Intact dorsalis pedis pulse  X-rays: CT scan reviewed of the left knee from last night.  No evidence of any acute fractures or dislocations.  Degenerative changes noted to the knee.  Anterior hematoma with a small area of extravasation which is likely  related to my attempted aspiration just prior to the knee CT scan and is in the correct location.  This appears to have self resolved on exam today.  Assessment: Left knee contusion anterior hematoma  Plan: I reviewed the clinical and radiographic findings with the patient.  Her exam is improved significantly with compressive wrap. we reapplied the compressive wrap gently at this time.  Advised her to keep it on for the next few days as the hematoma continues to resolve.  I have no concern for active extravasation at this time and feel that the hematoma will self resolve with time and the skin is not at risk at this time.  Will have the patient follow-up with myself in the office in 2 weeks for repeat evaluation I provided her follow-up information.  All questions answered patient agrees to the above plan.   Reinaldo Berber 03/26/2023, 5:18 PM

## 2023-03-26 NOTE — Plan of Care (Signed)
  Problem: Education: Goal: Knowledge of General Education information will improve Description: Including pain rating scale, medication(s)/side effects and non-pharmacologic comfort measures Outcome: Progressing   Problem: Health Behavior/Discharge Planning: Goal: Ability to manage health-related needs will improve Outcome: Progressing   Problem: Clinical Measurements: Goal: Will remain free from infection Outcome: Progressing   Problem: Clinical Measurements: Goal: Respiratory complications will improve Outcome: Progressing   Problem: Clinical Measurements: Goal: Cardiovascular complication will be avoided Outcome: Progressing   Problem: Activity: Goal: Risk for activity intolerance will decrease Outcome: Progressing   Problem: Pain Managment: Goal: General experience of comfort will improve and/or be controlled Outcome: Progressing   Problem: Safety: Goal: Ability to remain free from injury will improve Outcome: Progressing

## 2023-03-26 NOTE — Progress Notes (Signed)
 [facility] Progress Note  Patient: Ann Welch    ZOX:[id number]  DOA: 03/24/2023     Date of Service: the patient was seen and examined on 03/26/2023  Chief Complaint  Patient presents with   Fall   Brief hospital course: Ann Welch is a 89 y.o. female with PMH of paroxysmal A-fib, HTN, COPD, Mnire's disease, hard of hearing, anxiety, RLS, OSA, OA, GERD, as reviewed from EMR, presented to [facility] ED from senior housing with complaining of chest pain woke her up from the sleep.  Patient noticed heart rate in 150s and she took Cardizem and then she took aspirin.  Patient felt dizzy and fell hit her left knee, able to ambulate does not feel she had fracture.  She denied any chest pain.  Patient was brought in to the ED by EMS, physician from the facility mentioned that patient had chest pain and needed a chest pain workup.  On further questioning patient did say that she had chest pressure but not pain.  Chest pressure is in the center of the chest 5/10, no radiation, did not describe much.       ED Course: VS afebrile, HR 58, RR 19 BP 109/65, 96% on room air Troponin 883--- 1073 BMP sodium 130, BUN 20, serum osmolality 275, TSH 0.6 CBC within normal range EKG sinus rhythm, anterior lateral T wave abnormality   Cardiology was consulted TRH was consulted for admission and further management as below   Assessment and Plan:  # NSTEMI, demand ischemia due to tachycardia Troponin elevated, continue to trend Continue aspirin 81 mg p.o. daily, Nitro as needed for chest pain S/p heparin IV infusion DC'd in a.m. on 2/27 as per cardio TTE LVEF 60 to 65%, no WMA grade 1 diastolic dysfunction. Continue to monitor on telemetry Cardiology consulted, recommended no further intervention     # Paroxysmal SVT, back to sinus rhythm Patient is not on DOAC Continue to monitor on telemetry Resumed Toprol-XL home dose Use Cardizem IV as needed TSH 0.6 at lower end, follow free T4  level Cardiology consulted, recommended loop monitor to monitor arrhythmias.   Cleared for discharge and follow-up as an outpatient.  # Left knee hematoma s/p fall, and received heparin IV gtt which may have aggravated hematoma. Anticoagulation discontinued Discontinued aspirin as well CT left knee consistent with hematoma, no fracture. Orthopedic consulted, s/p needle aspiration, recommended pressure bandage and ice application, no any further intervention 2/28 patient feels improvement in the pain and able to ambulate with physical therapy. As per TOC patient declined SNF placement   # Isotonic hyponatremia, most likely due to decreased oral intake Sodium 131 Serum osmolality 275 within normal range Started sodium chloride 1 g p.o. 3 times daily for 3 days Monitor sodium level daily   # Fall and dizziness most likely due to A-fib with RVR Left knee impression Left knee x-ray negative for any fracture Developed left knee hematoma after heparin IV infusion as above Continue fall precautions PRN meds for pain control  Body mass index is 25.15 kg/m.  Interventions:  Diet: Heart healthy diet DVT Prophylaxis: SCD, pharmacological prophylaxis contraindicated due to bleeding    Advance goals of care discussion: DNR-limited  Family Communication: family was not present at bedside, at the time of interview.  The pt provided permission to discuss medical plan with the family. Opportunity was given to ask question and all questions were answered satisfactorily.   Disposition:  Pt is from Home, admitted with chest pain,  SVTs, fall with left knee hematoma, s/p Hep gtt, orthopedic consulted for left knee hematoma, which precludes a safe discharge. Discharge to home, when stable, may need few days to improve.  Subjective: No significant events overnight, knee pain 4-5/10, patient is feeling improvement, able to ambulate with physical therapy. Patient declined SNF placement, lives with  her son and son is working today. Patient remains at high risk for discharge today, we will plan to discharge her tomorrow a.m.  Physical Exam: General: NAD, lying comfortably Appear in no distress, affect appropriate Eyes: PERRLA ENT: Oral Mucosa Clear, moist  Neck: no JVD,  Cardiovascular: S1 and S2 Present, no Murmur,  Respiratory: good respiratory effort, Bilateral Air entry equal and Decreased, no Crackles, no wheezes Abdomen: Bowel Sound present, Soft and no tenderness,  Skin: no rashes Extremities: Left knee swelling and bruised due to hematoma, pressure bandage intact.  Advised to apply ice.  Neurologic: without any new focal findings Gait not checked due to patient safety concerns  Vitals:   03/25/23 1951 03/25/23 2146 03/26/23 0518 03/26/23 0743  BP: (!) 152/79  (!) 145/69 128/72  Pulse: 75 67  86  Resp: 19 17  16   Temp: 97.8 F (36.6 C)  97.6 F (36.4 C) 98.1 F (36.7 C)  TempSrc:   Oral   SpO2: 97% 96% 100% 95%  Weight:      Height:        Intake/Output Summary (Last 24 hours) at 03/26/2023 1545 Last data filed at 03/26/2023 1429 Gross per 24 hour  Intake 777 ml  Output --  Net 777 ml   Filed Weights   03/24/23 1052  Weight: 64.4 kg    Data Reviewed: I have personally reviewed and interpreted daily labs, tele strips, imagings as discussed above. I reviewed all nursing notes, pharmacy notes, vitals, pertinent old records I have discussed plan of care as described above with RN and patient/family.  CBC: Recent Labs  Lab 03/24/23 1251 03/25/23 0435 03/26/23 0514  WBC 9.2 7.7 7.8  HGB 12.7 11.1* 10.5*  HCT 38.4 33.6* 31.3*  MCV 93.0 92.1 92.1  PLT 170 148* 146*   Basic Metabolic Panel: Recent Labs  Lab 03/24/23 1251 03/24/23 1446 03/25/23 0435 03/26/23 0514  NA 130*  --  132* 131*  K 4.1  --  4.2 3.5  CL 97*  --  101 102  CO2 23  --  23 22  GLUCOSE 120*  --  108* 108*  BUN 16  --  11 8  CREATININE 0.68  --  0.65 0.50  CALCIUM 9.4  --   8.9 8.6*  MG  --  2.3 2.1 2.0  PHOS  --  3.7 3.4 2.8    Studies: CT KNEE LEFT W CONTRAST Result Date: 03/25/2023 CLINICAL DATA:  Hematoma after fall EXAM: CT OF THE LEFT KNEE WITH CONTRAST TECHNIQUE: Multidetector CT imaging was performed following the standard protocol during bolus administration of intravenous contrast. RADIATION DOSE REDUCTION: This exam was performed according to the departmental dose-optimization program which includes automated exposure control, adjustment of the mA and/or kV according to patient size and/or use of iterative reconstruction technique. CONTRAST:  OMNIPAQUE IOHEXOL 300 MG/ML  SOLN COMPARISON:  X-ray 03/24/2023 FINDINGS: Bones/Joint/Cartilage Moderate joint space loss of the medial compartment with osteophyte formation. Subchondral cyst formation along the tibial plateau. Sclerosis as well. More mild joint space loss of the patellofemoral joint. Global osteopenia. Small joint effusion. Ligaments Suboptimally assessed by CT. Muscles and Tendons Grossly preserved  musculature surrounding the knee. Evaluation of the tendons is limited but grossly intact appearance of the tendons including the quadriceps tendon and patellar tendon. If there is concern of internal derangement a follow-up Soft tissues Preserved enhancement of the popliteal artery and vein. There is a large area of some soft tissue thickening identified anterior along the knee, superficial to the course of the patella and tendons. Overall the area would measure proximally 7.7 by 2.5 by 12.8 cm. There is a smaller subset area within this with a fluid-fluid level and tiny area of active extravasation of contrast best seen on axial series 5, image 74. The subset of presumed more acute hematoma would measure 2.4 cm on series 5, image 76. IMPRESSION: Large area of soft tissue density identified anterior superficial to the patella and associated tendons measuring 7.7 x 2.5 x 12.8 cm. By density this could be a  hematoma as per history. There is also a smaller subset focus measuring 2.4 cm in diameter with a fluid-fluid level, possible more acute hematoma. And immediately lateral to this area is a small linear area of increased density which with the findings consistent with a small area of active extravasation. Osteopenia. No underlying acute fracture of the knee. Tiny joint effusion. Degenerative changes seen greatest of the medial compartment. Critical Value/emergent results were called by telephone at the time of interpretation on 03/25/2023 at 8:07 pm to provider Manuela Schwartz, who verbally acknowledged these results. Electronically Signed   By: Karen Kays M.D.   On: 03/25/2023 20:58    Scheduled Meds:  metoprolol succinate  50 mg Oral Daily   pantoprazole  40 mg Oral Daily   sodium chloride flush  3 mL Intravenous Q12H   sodium chloride  1 g Oral TID WC   traZODone  25 mg Oral QHS   Continuous Infusions: PRN Meds: acetaminophen **OR** acetaminophen, diltiazem, morphine injection, nitroGLYCERIN, ondansetron **OR** ondansetron (ZOFRAN) IV, polyethylene glycol, sodium chloride flush  Time spent: 45 minutes  Author: Gillis Santa. MD [facility] 03/26/2023 3:45 PM  To reach On-call, see care teams to locate the attending and reach out to them via [email or web address]. If 7PM-7AM, please contact night-coverage If you still have difficulty reaching the attending provider, please page the [facility] (Director on Call) for [facility] on amion for assistance.

## 2023-03-27 ENCOUNTER — Inpatient Hospital Stay: Payer: Medicare (Managed Care)

## 2023-03-27 DIAGNOSIS — I214 Non-ST elevation (NSTEMI) myocardial infarction: Secondary | ICD-10-CM | POA: Diagnosis not present

## 2023-03-27 LAB — CBC
HCT: 30.5 % — ABNORMAL LOW (ref 36.0–46.0)
Hemoglobin: 10.5 g/dL — ABNORMAL LOW (ref 12.0–15.0)
MCH: 31.3 pg (ref 26.0–34.0)
MCHC: 34.4 g/dL (ref 30.0–36.0)
MCV: 90.8 fL (ref 80.0–100.0)
Platelets: 152 10*3/uL (ref 150–400)
RBC: 3.36 MIL/uL — ABNORMAL LOW (ref 3.87–5.11)
RDW: 12.9 % (ref 11.5–15.5)
WBC: 8.5 10*3/uL (ref 4.0–10.5)
nRBC: 0 % (ref 0.0–0.2)

## 2023-03-27 LAB — BASIC METABOLIC PANEL
Anion gap: 8 (ref 5–15)
BUN: 9 mg/dL (ref 8–23)
CO2: 23 mmol/L (ref 22–32)
Calcium: 8.8 mg/dL — ABNORMAL LOW (ref 8.9–10.3)
Chloride: 102 mmol/L (ref 98–111)
Creatinine, Ser: 0.51 mg/dL (ref 0.44–1.00)
GFR, Estimated: >60 mL/min (ref >60)
Glucose, Bld: 114 mg/dL — ABNORMAL HIGH (ref 70–99)
Potassium: 3.7 mmol/L (ref 3.5–5.1)
Sodium: 133 mmol/L — ABNORMAL LOW (ref 135–145)

## 2023-03-27 LAB — PHOSPHORUS: Phosphorus: 2.5 mg/dL (ref 2.5–4.6)

## 2023-03-27 LAB — MAGNESIUM: Magnesium: 2.1 mg/dL (ref 1.7–2.4)

## 2023-03-27 NOTE — Progress Notes (Signed)
 HR went up into 170s.  VS checked.  Pt symptomatic - can fell heart beating chest pressure, & SOB.  Pt reports she got up and went to the bathroom.  When asked if her heart usually goes up with activity, she states "has been happening a lot here lately".  Pt asked for CPAP.  MEWS Red.  Admin 10 PRN cardizem at 2041.  HR in high 70s at 2100. MEWS currently Yellow.  Charge and Provider notified.

## 2023-03-27 NOTE — Plan of Care (Signed)

## 2023-03-27 NOTE — Progress Notes (Signed)
 [facility] Progress Note  Patient: Ann Welch    GUY:[id number]  DOA: 03/24/2023     Date of Service: the patient was seen and examined on 03/27/2023  Chief Complaint  Patient presents with   Fall   Brief hospital course: Ann Welch is a 89 y.o. female with PMH of paroxysmal A-fib, HTN, COPD, Mnire's disease, hard of hearing, anxiety, RLS, OSA, OA, GERD, as reviewed from EMR, presented to [facility] ED from senior housing with complaining of chest pain woke her up from the sleep.  Patient noticed heart rate in 150s and she took Cardizem and then she took aspirin.  Patient felt dizzy and fell hit her left knee, able to ambulate does not feel she had fracture.  She denied any chest pain.  Patient was brought in to the ED by EMS, physician from the facility mentioned that patient had chest pain and needed a chest pain workup.  On further questioning patient did say that she had chest pressure but not pain.  Chest pressure is in the center of the chest 5/10, no radiation, did not describe much.       ED Course: VS afebrile, HR 58, RR 19 BP 109/65, 96% on room air Troponin 883--- 1073 BMP sodium 130, BUN 20, serum osmolality 275, TSH 0.6 CBC within normal range EKG sinus rhythm, anterior lateral T wave abnormality   Cardiology was consulted TRH was consulted for admission and further management as below   Assessment and Plan:  # NSTEMI, demand ischemia due to tachycardia Troponin elevated, continue to trend Continue aspirin 81 mg p.o. daily, Nitro as needed for chest pain S/p heparin IV infusion DC'd in a.m. on 2/27 as per cardio TTE LVEF 60 to 65%, no WMA grade 1 diastolic dysfunction. Continue to monitor on telemetry Cardiology consulted, recommended no further intervention     # Paroxysmal SVT, back to sinus rhythm Patient is not on DOAC Continue to monitor on telemetry Resumed Toprol-XL home dose Use Cardizem IV as needed TSH 0.6 at lower end, follow free T4  level Cardiology consulted, recommended loop monitor to monitor arrhythmias.   Cleared for discharge and follow-up as an outpatient. 3/1 pt had an episode of SVT in the evening yesterday, resolved with IV Cardizem 10 mg   # Left knee hematoma s/p fall, and received heparin IV gtt which may have aggravated hematoma. Anticoagulation discontinued Discontinued aspirin as well CT left knee consistent with hematoma, no fracture. Orthopedic consulted, s/p needle aspiration, recommended pressure bandage and ice application, no any further intervention 2/28 patient feels improvement in the pain and able to ambulate with physical therapy. As per TOC patient declined SNF placement   # Isotonic hyponatremia, most likely due to decreased oral intake Sodium 133 Serum osmolality 275 within normal range Started sodium chloride 1 g p.o. 3 times daily for 3 days Monitor sodium level daily   # Fall and dizziness most likely due to A-fib with RVR Left knee impression Left knee x-ray negative for any fracture Developed left knee hematoma after heparin IV infusion as above Continue fall precautions PRN meds for pain control  # Abdominal discomfort and frequency of defecation Patient with more than 5 x 4 BM, small amount and soft Denied any diarrhea or constipation Complaining of abdominal discomfort Follow x-ray KUB to rule out fecal impaction or constipation   Body mass index is 25.15 kg/m.  Interventions:  Diet: Heart healthy diet DVT Prophylaxis: SCD, pharmacological prophylaxis contraindicated due to bleeding  Advance goals of care discussion: DNR-limited  Family Communication: family was not present at bedside, at the time of interview.  The pt provided permission to discuss medical plan with the family. Opportunity was given to ask question and all questions were answered satisfactorily.   Disposition:  Pt is from Home, admitted with chest pain, SVTs, fall with left knee hematoma,  s/p Hep gtt, orthopedic consulted for left knee hematoma, which precludes a safe discharge. Discharge to home, when stable, may need few days to improve.  Subjective: No significant events overnight, since morning patient is complaining of abdominal discomfort and had more than 5 episode of BM, small amount and soft.  Denies any constipation or diarrhea. Patient does not feel comfortable going home in this condition and would like to stay and go home tomorrow a.m. because she lives alone. Patient remains at high risk for readmission so we will continue to monitor today and plan for discharge tomorrow a.m.  Physical Exam: General: NAD, lying comfortably Appear in no distress, affect appropriate Eyes: PERRLA ENT: Oral Mucosa Clear, moist  Neck: no JVD,  Cardiovascular: S1 and S2 Present, no Murmur,  Respiratory: good respiratory effort, Bilateral Air entry equal and Decreased, no Crackles, no wheezes Abdomen: Bowel Sound present, Soft and no tenderness,  Skin: no rashes Extremities: Left knee swelling and bruised due to hematoma, pressure bandage intact.  Advised to apply ice.  Neurologic: without any new focal findings Gait not checked due to patient safety concerns  Vitals:   03/26/23 2346 03/27/23 0427 03/27/23 0741 03/27/23 1203  BP: 116/65 (!) 146/75 (!) 150/72 129/62  Pulse:   71 76  Resp: 18  18 18   Temp: 98.1 F (36.7 C) 98.1 F (36.7 C) 98.2 F (36.8 C) 97.6 F (36.4 C)  TempSrc: Oral Oral    SpO2: 96% 97% 95% 95%  Weight:      Height:        Intake/Output Summary (Last 24 hours) at 03/27/2023 1621 Last data filed at 03/26/2023 2119 Gross per 24 hour  Intake 240 ml  Output 200 ml  Net 40 ml   Filed Weights   03/24/23 1052  Weight: 64.4 kg    Data Reviewed: I have personally reviewed and interpreted daily labs, tele strips, imagings as discussed above. I reviewed all nursing notes, pharmacy notes, vitals, pertinent old records I have discussed plan of care as  described above with RN and patient/family.  CBC: Recent Labs  Lab 03/24/23 1251 03/25/23 0435 03/26/23 0514 03/27/23 0440  WBC 9.2 7.7 7.8 8.5  HGB 12.7 11.1* 10.5* 10.5*  HCT 38.4 33.6* 31.3* 30.5*  MCV 93.0 92.1 92.1 90.8  PLT 170 148* 146* 152   Basic Metabolic Panel: Recent Labs  Lab 03/24/23 1251 03/24/23 1446 03/25/23 0435 03/26/23 0514 03/27/23 0440  NA 130*  --  132* 131* 133*  K 4.1  --  4.2 3.5 3.7  CL 97*  --  101 102 102  CO2 23  --  23 22 23   GLUCOSE 120*  --  108* 108* 114*  BUN 16  --  11 8 9   CREATININE 0.68  --  0.65 0.50 0.51  CALCIUM 9.4  --  8.9 8.6* 8.8*  MG  --  2.3 2.1 2.0 2.1  PHOS  --  3.7 3.4 2.8 2.5    Studies: No results found.   Scheduled Meds:  metoprolol succinate  50 mg Oral Daily   pantoprazole  40 mg Oral Daily  traZODone  25 mg Oral QHS   Continuous Infusions: PRN Meds: acetaminophen **OR** acetaminophen, alum & mag hydroxide-simeth, diltiazem, morphine injection, nitroGLYCERIN, ondansetron **OR** ondansetron (ZOFRAN) IV, polyethylene glycol  Time spent: 45 minutes  Author: Gillis Santa. MD [facility] 03/27/2023 4:21 PM  To reach On-call, see care teams to locate the attending and reach out to them via [email or web address]. If 7PM-7AM, please contact night-coverage If you still have difficulty reaching the attending provider, please page the [facility] (Director on Call) for [facility] on amion for assistance.

## 2023-03-27 NOTE — Progress Notes (Deleted)
   03/26/23 2336  Assess: MEWS Score  Temp 98.1 F (36.7 C)  BP (!) 139/94  MAP (mmHg) 107  ECG Heart Rate (!) 160  Resp 20  SpO2 98 %  O2 Device CPAP  Assess: if the MEWS score is Yellow or Red  Were vital signs accurate and taken at a resting state? Yes  Notify: Charge Nurse/RN  Name of Charge Nurse/RN Notified Ranae Pila, RN

## 2023-03-27 NOTE — Progress Notes (Signed)
 HR down at 77 after IV pushed 10 mg cardizem PRN administration.    03/26/23 2336  Assess: MEWS Score  Temp 98.1 F (36.7 C)  BP (!) 139/94  MAP (mmHg) 107  ECG Heart Rate (!) 160  Resp 20  Level of Consciousness Alert  SpO2 98 %  O2 Device CPAP  Assess: MEWS Score  MEWS Temp 0  MEWS Systolic 0  MEWS Pulse 3  MEWS RR 0  MEWS LOC 0  MEWS Score 3  MEWS Score Color Yellow  Assess: if the MEWS score is Yellow or Red  Were vital signs accurate and taken at a resting state? Yes  Does the patient meet 2 or more of the SIRS criteria? No  MEWS guidelines implemented  No, previously yellow, continue vital signs every 4 hours  Notify: Charge Nurse/RN  Name of Charge Nurse/RN Notified Ranae Pila, RN  Assess: SIRS CRITERIA  SIRS Temperature  0  SIRS Respirations  0  SIRS Pulse 1  SIRS WBC 0  SIRS Score Sum  1

## 2023-03-28 DIAGNOSIS — I214 Non-ST elevation (NSTEMI) myocardial infarction: Secondary | ICD-10-CM | POA: Diagnosis not present

## 2023-03-28 LAB — BASIC METABOLIC PANEL
Anion gap: 7 (ref 5–15)
BUN: 7 mg/dL — ABNORMAL LOW (ref 8–23)
CO2: 25 mmol/L (ref 22–32)
Calcium: 8.9 mg/dL (ref 8.9–10.3)
Chloride: 102 mmol/L (ref 98–111)
Creatinine, Ser: 0.5 mg/dL (ref 0.44–1.00)
GFR, Estimated: >60 mL/min (ref >60)
Glucose, Bld: 111 mg/dL — ABNORMAL HIGH (ref 70–99)
Potassium: 3.5 mmol/L (ref 3.5–5.1)
Sodium: 134 mmol/L — ABNORMAL LOW (ref 135–145)

## 2023-03-28 LAB — CBC
HCT: 31.4 % — ABNORMAL LOW (ref 36.0–46.0)
Hemoglobin: 10.8 g/dL — ABNORMAL LOW (ref 12.0–15.0)
MCH: 30.9 pg (ref 26.0–34.0)
MCHC: 34.4 g/dL (ref 30.0–36.0)
MCV: 90 fL (ref 80.0–100.0)
Platelets: 178 10*3/uL (ref 150–400)
RBC: 3.49 MIL/uL — ABNORMAL LOW (ref 3.87–5.11)
RDW: 13.1 % (ref 11.5–15.5)
WBC: 7.5 10*3/uL (ref 4.0–10.5)
nRBC: 0 % (ref 0.0–0.2)

## 2023-03-28 MED ORDER — DILTIAZEM HCL ER COATED BEADS 120 MG PO CP24: 120.0000 mg | ORAL_CAPSULE | Freq: Every day | ORAL | Status: DC

## 2023-03-28 MED ORDER — DILTIAZEM HCL ER COATED BEADS 120 MG PO CP24: 120.0000 mg | ORAL_CAPSULE | Freq: Every day | ORAL | 11 refills | Status: AC

## 2023-03-28 MED ORDER — ESCITALOPRAM OXALATE 10 MG PO TABS
5.0000 mg | ORAL_TABLET | Freq: Every day | ORAL | Status: DC
  Administered 2023-03-28: 5 mg via ORAL
  Filled 2023-03-28: qty 1

## 2023-03-28 NOTE — Discharge Summary (Signed)
 [facility] Discharge Summary   Patient: Ann Welch ZOX:[id number]  PCP: [facility]  Date of admission: 03/24/2023   Date of discharge:  03/28/2023     Discharge Diagnoses:  Principal Problem:   NSTEMI (non-ST elevated myocardial infarction) [facility]) Active Problems:   Contusion of left knee   Admitted From: Home Disposition:  Home   Recommendations for Outpatient Follow-up:  Follow-up with PCP in 1 week, continue to monitor BP and heart rate at home and follow with PCP and cardiology. Started on Cardizem CD1 20 mg p.o. daily and use Cardizem IR 30 mg p.o. 3 times daily as needed if heart rate greater than 120 bpm Follow with cardiology in 1 week Follow up LABS/TEST: CBC and BMP in 1 to 2 weeks   Follow-up Information     Custovic, Sabina, DO. Go in 1 week(s).   Specialty: Cardiology Contact information: [address] [place] [address] [phone]         [facility] Follow up in 1 week(s).   Contact information: [address] [place] [address] [phone]                Diet recommendation: Cardiac diet  Activity: The patient is advised to gradually reintroduce usual activities, as tolerated  Discharge Condition: stable  Code Status: DNR /DNI-limited  History of present illness: As per the H and P dictated on admission Hospital Course:  Ann Welch is a 89 y.o. female with PMH of paroxysmal A-fib, HTN, COPD, Mnire's disease, hard of hearing, anxiety, RLS, OSA, OA, GERD, as reviewed from EMR, presented to [facility] ED from senior housing with complaining of chest pain woke her up from the sleep.  Patient noticed heart rate in 150s and she took Cardizem and then she took aspirin.  Patient felt dizzy and fell hit her left knee, able to ambulate does not feel she had fracture.  She denied any chest pain.  Patient was brought in to the ED by EMS, physician from the  facility mentioned that patient had chest pain and needed a chest pain workup.  On further questioning patient did say that she had chest pressure but not pain.  Chest pressure is in the center of the chest 5/10, no radiation, did not describe much.       ED Course: VS afebrile, HR 58, RR 19 BP 109/65, 96% on room air Troponin 883--- 1073 BMP sodium 130, BUN 20, serum osmolality 275, TSH 0.6 CBC within normal range EKG sinus rhythm, anterior lateral T wave abnormality   Cardiology was consulted TRH was consulted for admission and further management as below  Assessment and Plan:   # NSTEMI, demand ischemia due to tachycardia Troponin elevated, Continue aspirin 81 mg p.o. daily, s/p Nitro prn S/p heparin IV infusion DC'd in a.m. on 2/27 as per cardio TTE LVEF 60 to 65%, no WMA grade 1 diastolic dysfunction. Cardiology consulted, recommended no further ischemic workup    # Paroxysmal SVT, back to sinus rhythm Patient is not on DOAC, cardiac monitor placed and will be monitored by cardiology as an outpatient. Resumed Toprol-XL 50 mg p.o. daily.  Patient had multiple episodes of the SVT during hospital stay and received Cardizem IV which resolved SVTs. TSH 0.6 at lower end, follow free T4 level Cardiology was following, recommended to start Cardizem CD 120 mg p.o. daily. Resumed Cardizem IR 30 mg p.o. q. 8 hourly as needed  if HR >120 BPM Patient was advised to monitor BP and heart rate at home and follow with PCP and cardiology as an outpatient.   # Left knee hematoma s/p fall, and received heparin IV gtt which may have aggravated hematoma.  Heparin gtt was discontinued and Discontinued aspirin as well during hospital stay. CT left knee consistent with hematoma, no fracture. Orthopedic consulted, s/p needle aspiration, recommended pressure bandage and ice application, no any further intervention.  Patient feels improvement in the left knee hematoma, still has bruising, inflammation has  resolved.  Pain is under control patient is able to ambulate.  Patient was advised to apply ice intermittently and follow-up with PCP as an outpatient.  Resumed aspirin on discharge. Patient was seen by PT and OT, recommended SNF placement but she declined.  # Isotonic hyponatremia, most likely due to decreased oral intake Sodium 134 today. Serum osmolality 275 within normal range S/p sodium chloride 1 g p.o. 3 times daily for 3 days.  Repeat BMP after 1 to 2 weeks, follow with PCP.   # Fall and dizziness most likely due to A-fib with RVR Left knee impression. Left knee x-ray negative for any fracture Developed left knee hematoma after heparin IV infusion as above Continue fall precautions, and PRN meds for pain control # Abdominal discomfort and frequency of defecation developed on 3/1 Patient with more than 5 x 4 BM, small amount and soft, Denied any diarrhea or constipation Complaining of abdominal discomfort X-ray KUB did not show any significant findings.  Today patient is feeling fine, denied any abdominal discomfort, no diarrhea, no BM today 3/2.  Body mass index is 25.15 kg/m.  Nutrition Interventions:  - Patient was instructed, not to drive, operate heavy machinery, perform activities at heights, swimming or participation in water activities or provide baby sitting services while on Pain, Sleep and Anxiety Medications; until her outpatient Physician has advised to do so again.  - Also recommended to not to take more than prescribed Pain, Sleep and Anxiety Medications.  Patient was seen by physical therapy, who recommended Therapy, SNF placement but patient declined.  Home health was arranged by TOC.   On the day of the discharge the patient's vitals were stable, and no other acute medical condition were reported by patient. the patient was felt safe to be discharge at Home with Therapy.  Consultants: Cardiology and orthopedics Procedures: left knee needle aspiration by  Ortho  Discharge Exam: General: Appear in no distress, no Rash; Oral Mucosa Clear, moist. Cardiovascular: S1 and S2 Present, no Murmur, Respiratory: normal respiratory effort, Bilateral Air entry present and no Crackles, no wheezes Abdomen: Bowel Sound present, Soft and no tenderness, no hernia Extremities: Left knee mild edema and bruise due to hematoma, mild Pedal edema, no calf tenderness Neurology: alert and oriented to time, place, and person affect appropriate.  Filed Weights   03/24/23 1052  Weight: 64.4 kg   Vitals:   03/28/23 0746 03/28/23 1258  BP: 122/78 136/85  Pulse: 87 70  Resp: 18 18  Temp: 97.9 F (36.6 C) 98.2 F (36.8 C)  SpO2: 97% 96%    DISCHARGE MEDICATION: Allergies as of 03/28/2023       Reactions   Chocolate    HA        Medication List     TAKE these medications    alum & mag hydroxide-simeth 200-200-20 MG/5ML suspension Commonly known as: MAALOX/MYLANTA Take by mouth every 6 (six) hours as needed for indigestion or heartburn.  aspirin EC 81 MG tablet Take 81 mg by mouth at bedtime. pm   Biotene Dry Mouth Moisturizing Soln Take 2 sprays by mouth 6 (six) times daily. AS NEEDED   CENTRUM SILVER 50+WOMEN PO Take by mouth.   cholecalciferol 1000 units tablet Commonly known as: VITAMIN D Take 1,000 Units by mouth daily.   cyanocobalamin 1000 MCG tablet Commonly known as: VITAMIN B12 Take 1,000 mcg by mouth daily.   diltiazem 120 MG 24 hr capsule Commonly known as: CARDIZEM CD Take 1 capsule (120 mg total) by mouth daily.   diltiazem 30 MG tablet Commonly known as: CARDIZEM Take 1 tablet (30 mg total) by mouth 3 (three) times daily as needed (HR >120). What changed:  when to take this reasons to take this   escitalopram 5 MG tablet Commonly known as: LEXAPRO Take 5 mg by mouth daily.   ketotifen 0.025 % ophthalmic solution Commonly known as: ZADITOR Place 1 drop into both eyes 2 (two) times daily as needed for dry  eyes.   meclizine 25 MG tablet Commonly known as: ANTIVERT Take 25 mg by mouth 3 (three) times daily as needed for dizziness.   melatonin 5 MG Tabs Take 2 tablets by mouth at bedtime.   metoprolol succinate 50 MG 24 hr tablet Commonly known as: TOPROL-XL Take 50 mg by mouth daily.   nitroGLYCERIN 0.4 MG SL tablet Commonly known as: NITROSTAT Place 0.4 mg under the tongue every 5 (five) minutes as needed for chest pain.   omeprazole 20 MG capsule Commonly known as: PRILOSEC Take 20 mg by mouth daily.   polyethylene glycol 17 g packet Commonly known as: MIRALAX / GLYCOLAX Take 17 g by mouth daily.   senna 8.6 MG Tabs tablet Commonly known as: SENOKOT Take 2 tablets by mouth 2 (two) times daily.   traZODone 50 MG tablet Commonly known as: DESYREL Take 25 mg by mouth at bedtime.   vitamin E 180 MG (400 UNITS) capsule Take 400 Units by mouth daily.       Allergies  Allergen Reactions   Chocolate     HA   Discharge Instructions     Call MD for:  difficulty breathing, headache or visual disturbances   Complete by: As directed    Call MD for:  extreme fatigue   Complete by: As directed    Call MD for:  persistant dizziness or light-headedness   Complete by: As directed    Call MD for:  persistant nausea and vomiting   Complete by: As directed    Call MD for:  severe uncontrolled pain   Complete by: As directed    Call MD for:  temperature >100.4   Complete by: As directed    Diet - low sodium heart healthy   Complete by: As directed    Discharge instructions   Complete by: As directed    Follow-up with PCP in 1 week, continue to monitor BP and heart rate at home and follow with PCP and cardiology. Started on Cardizem CD1 20 mg p.o. daily and use Cardizem IR 30 mg p.o. 3 times daily as needed if heart rate greater than 120 bpm Follow with cardiology in 1 week   Increase activity slowly   Complete by: As directed        The results of significant  diagnostics from this hospitalization (including imaging, microbiology, ancillary and laboratory) are listed below for reference.    Significant Diagnostic Studies: DG Abd 1 View Result Date: 03/27/2023  CLINICAL DATA:  Abdominal pain EXAM: ABDOMEN - 1 VIEW COMPARISON:  Pelvic radiographs 07/19/2017 FINDINGS: Supine views of the abdomen and pelvis. Gas within normal caliber colon and stomach. No significant gaseous distention of small-bowel loops. No gross free intraperitoneal air. Probable phleboliths in the pelvis. Moderate convex left lumbar spine curvature. IMPRESSION: No acute findings. Electronically Signed   By: Jeronimo Greaves M.D.   On: 03/27/2023 16:57   CT KNEE LEFT W CONTRAST Result Date: 03/25/2023 CLINICAL DATA:  Hematoma after fall EXAM: CT OF THE LEFT KNEE WITH CONTRAST TECHNIQUE: Multidetector CT imaging was performed following the standard protocol during bolus administration of intravenous contrast. RADIATION DOSE REDUCTION: This exam was performed according to the departmental dose-optimization program which includes automated exposure control, adjustment of the mA and/or kV according to patient size and/or use of iterative reconstruction technique. CONTRAST:  OMNIPAQUE IOHEXOL 300 MG/ML  SOLN COMPARISON:  X-ray 03/24/2023 FINDINGS: Bones/Joint/Cartilage Moderate joint space loss of the medial compartment with osteophyte formation. Subchondral cyst formation along the tibial plateau. Sclerosis as well. More mild joint space loss of the patellofemoral joint. Global osteopenia. Small joint effusion. Ligaments Suboptimally assessed by CT. Muscles and Tendons Grossly preserved musculature surrounding the knee. Evaluation of the tendons is limited but grossly intact appearance of the tendons including the quadriceps tendon and patellar tendon. If there is concern of internal derangement a follow-up Soft tissues Preserved enhancement of the popliteal artery and vein. There is a large area of  some soft tissue thickening identified anterior along the knee, superficial to the course of the patella and tendons. Overall the area would measure proximally 7.7 by 2.5 by 12.8 cm. There is a smaller subset area within this with a fluid-fluid level and tiny area of active extravasation of contrast best seen on axial series 5, image 74. The subset of presumed more acute hematoma would measure 2.4 cm on series 5, image 76. IMPRESSION: Large area of soft tissue density identified anterior superficial to the patella and associated tendons measuring 7.7 x 2.5 x 12.8 cm. By density this could be a hematoma as per history. There is also a smaller subset focus measuring 2.4 cm in diameter with a fluid-fluid level, possible more acute hematoma. And immediately lateral to this area is a small linear area of increased density which with the findings consistent with a small area of active extravasation. Osteopenia. No underlying acute fracture of the knee. Tiny joint effusion. Degenerative changes seen greatest of the medial compartment. Critical Value/emergent results were called by telephone at the time of interpretation on 03/25/2023 at 8:07 pm to provider Manuela Schwartz, who verbally acknowledged these results. Electronically Signed   By: Karen Kays M.D.   On: 03/25/2023 20:58   ECHOCARDIOGRAM COMPLETE Result Date: 03/25/2023    ECHOCARDIOGRAM REPORT   Patient Name:   Tamieka MUNOZ RAMOS Date of Exam: 03/24/2023 Medical Rec #:  [id number]       Height:       63.0 in Accession #:    [id number]      Weight:       142.0 lb Date of Birth:  1931/04/14       BSA:          1.672 m Patient Age:    89 years        BP:           109/65 mmHg Patient Gender: F               HR:  63 bpm. Exam Location:  [facility] Procedure: 2D Echo, Cardiac Doppler and Color Doppler (Both Spectral and Color            Flow Doppler were utilized during procedure). Indications:     I21.4 NSTEMI  History:         Patient has no prior history of  Echocardiogram examinations.                  COPD, Signs/Symptoms:Dyspnea; Risk Factors:Hypertension and                  Sleep Apnea.  Sonographer:     Daphine Deutscher RDCS Referring Phys:  [id number] Dorian Pod HUDSON Diagnosing Phys: Marcina Millard MD IMPRESSIONS  1. Left ventricular ejection fraction, by estimation, is 60 to 65%. The left ventricle has normal function. The left ventricle has no regional wall motion abnormalities. There is mild left ventricular hypertrophy. Left ventricular diastolic parameters are consistent with Grade I diastolic dysfunction (impaired relaxation).  2. Right ventricular systolic function is normal. The right ventricular size is normal.  3. The mitral valve is normal in structure. Mild mitral valve regurgitation. No evidence of mitral stenosis.  4. The aortic valve is normal in structure. Aortic valve regurgitation is mild. No aortic stenosis is present.  5. The inferior vena cava is normal in size with greater than 50% respiratory variability, suggesting right atrial pressure of 3 mmHg. FINDINGS  Left Ventricle: Left ventricular ejection fraction, by estimation, is 60 to 65%. The left ventricle has normal function. The left ventricle has no regional wall motion abnormalities. Strain imaging was not performed. The left ventricular internal cavity  size was normal in size. There is mild left ventricular hypertrophy. Left ventricular diastolic parameters are consistent with Grade I diastolic dysfunction (impaired relaxation). Right Ventricle: The right ventricular size is normal. No increase in right ventricular wall thickness. Right ventricular systolic function is normal. Left Atrium: Left atrial size was normal in size. Right Atrium: Right atrial size was normal in size. Pericardium: There is no evidence of pericardial effusion. Mitral Valve: The mitral valve is normal in structure. Mild mitral valve regurgitation. No evidence of mitral valve stenosis. Tricuspid Valve:  The tricuspid valve is normal in structure. Tricuspid valve regurgitation is mild . No evidence of tricuspid stenosis. Aortic Valve: The aortic valve is normal in structure. Aortic valve regurgitation is mild. No aortic stenosis is present. Pulmonic Valve: The pulmonic valve was normal in structure. Pulmonic valve regurgitation is not visualized. No evidence of pulmonic stenosis. Aorta: The aortic root is normal in size and structure. Venous: The inferior vena cava is normal in size with greater than 50% respiratory variability, suggesting right atrial pressure of 3 mmHg. IAS/Shunts: No atrial level shunt detected by color flow Doppler. Additional Comments: 3D imaging was not performed.  LEFT VENTRICLE PLAX 2D LVIDd:         4.60 cm Diastology LVIDs:         2.70 cm LV e' medial:    7.18 cm/s LV PW:         0.80 cm LV E/e' medial:  10.0 LV IVS:        0.80 cm LV e' lateral:   10.02 cm/s                        LV E/e' lateral: 7.2  RIGHT VENTRICLE             IVC RV Basal diam:  3.50 cm     IVC diam: 1.30 cm RV S prime:     12.83 cm/s TAPSE (M-mode): 2.6 cm LEFT ATRIUM             Index        RIGHT ATRIUM           Index LA diam:        4.10 cm 2.45 cm/m   RA Area:     13.30 cm LA Vol (A2C):   33.6 ml 20.10 ml/m  RA Volume:   34.30 ml  20.52 ml/m LA Vol (A4C):   28.0 ml 16.75 ml/m LA Biplane Vol: 30.6 ml 18.30 ml/m  AORTIC VALVE LVOT Vmax:   133.00 cm/s LVOT Vmean:  85.450 cm/s LVOT VTI:    0.266 m  AORTA Ao Root diam: 3.40 cm Ao Asc diam:  3.50 cm MITRAL VALVE MV Area (PHT): 3.24 cm     SHUNTS MV Decel Time: 234 msec     Systemic VTI: 0.27 m MV E velocity: 71.80 cm/s MV A velocity: 111.00 cm/s MV E/A ratio:  0.65 Marcina Millard MD Electronically signed by Marcina Millard MD Signature Date/Time: 03/25/2023/1:10:55 PM    Final    DG Chest 2 View Result Date: 03/24/2023 CLINICAL DATA:  Chest pain after fall. EXAM: CHEST - 2 VIEW COMPARISON:  Chest radiograph dated 10/06/2020. FINDINGS: The heart size  and mediastinal contours are within normal limits. No focal consolidation, pleural effusion, or pneumothorax. Diffuse osseous demineralization. Moderate compression deformities of the T12 and L1 vertebral bodies are again noted. Age-indeterminate superior endplate compression deformity of the T8 vertebral body. No obvious displaced rib fracture. IMPRESSION: 1. Mild right basilar atelectasis. Otherwise, no acute cardiopulmonary findings. 2. Age-indeterminate superior endplate compression deformity of the T8 vertebral body. 3. Redemonstrated moderate compression deformities of the T12 and L1 vertebral bodies. Electronically Signed   By: Hart Robinsons M.D.   On: 03/24/2023 14:40   DG Knee Complete 4 Views Left Result Date: 03/24/2023 CLINICAL DATA:  Pain after fall.  Swelling EXAM: LEFT KNEE - COMPLETE 4 VIEW COMPARISON:  None Available. FINDINGS: Osteopenia. Moderate joint space loss of the medial compartment with some small osteophytes. Preserved lateral compartment and patellofemoral joint. No fracture or dislocation. No joint effusion on lateral view. IMPRESSION: Degenerative changes of the medial compartment.  Osteopenia Electronically Signed   By: Karen Kays M.D.   On: 03/24/2023 12:24    Microbiology: No results found for this or any previous visit (from the past 240 hours).   Labs: CBC: Recent Labs  Lab 03/24/23 1251 03/25/23 0435 03/26/23 0514 03/27/23 0440 03/28/23 0533  WBC 9.2 7.7 7.8 8.5 7.5  HGB 12.7 11.1* 10.5* 10.5* 10.8*  HCT 38.4 33.6* 31.3* 30.5* 31.4*  MCV 93.0 92.1 92.1 90.8 90.0  PLT 170 148* 146* 152 178   Basic Metabolic Panel: Recent Labs  Lab 03/24/23 1251 03/24/23 1446 03/25/23 0435 03/26/23 0514 03/27/23 0440 03/28/23 0533  NA 130*  --  132* 131* 133* 134*  K 4.1  --  4.2 3.5 3.7 3.5  CL 97*  --  101 102 102 102  CO2 23  --  23 22 23 25   GLUCOSE 120*  --  108* 108* 114* 111*  BUN 16  --  11 8 9  7*  CREATININE 0.68  --  0.65 0.50 0.51 0.50  CALCIUM  9.4  --  8.9 8.6* 8.8* 8.9  MG  --  2.3 2.1 2.0 2.1  --  PHOS  --  3.7 3.4 2.8 2.5  --    Liver Function Tests: No results for input(s): "AST", "ALT", "ALKPHOS", "BILITOT", "PROT", "ALBUMIN" in the last 168 hours. No results for input(s): "LIPASE", "AMYLASE" in the last 168 hours. No results for input(s): "AMMONIA" in the last 168 hours. Cardiac Enzymes: No results for input(s): "CKTOTAL", "CKMB", "CKMBINDEX", "TROPONINI" in the last 168 hours. BNP (last 3 results) No results for input(s): "BNP" in the last 8760 hours. CBG: No results for input(s): "GLUCAP" in the last 168 hours.  Time spent: 35 minutes  Signed:  Gillis Santa  [facility] 03/28/2023 2:01 PM

## 2023-03-28 NOTE — Progress Notes (Signed)
 2:15 - Pt called desk - reports she was awoken from sleep by her heart palpitations.  Assessed - pt symptomatic with chest pressure.  HR 171.  VS taken.  Admin prn 10 IV cardizem.  HR 74 when RN left the room.  Pt resting comfortably.

## 2023-03-28 NOTE — TOC Transition Note (Signed)
 Transition of Care [facility] - Discharge Note   Patient Details  Name: Ann Welch MRN: [id number] Date of Birth: 12/24/1931  Transition of [facility]) CM/SW Contact:  Bing Quarry, RN Phone Number: 03/28/2023, 3:13 PM   Clinical Narrative: 3/2: Patient has discharge orders in for today. Per prior CM notes, patient is set up with [facility] for post discharge care. Son will transport home today.    Gabriel Cirri MSN RN CM  RN Case Manager [place]  Transitions of Care Direct Dial: [phone] (Weekends Only) [facility] Main Office Phone: [phone] [facility] Fax: [phone] [email or web address]       Final next level of care: Home w Home Health Services ([facility]) Barriers to Discharge: Barriers Resolved   Patient Goals and CMS Choice Patient states their goals for this hospitalization and ongoing recovery are:: declined SNF CMS Medicare.gov Compare Post Acute Care list provided to:: Patient Choice offered to / list presented to : Patient      Discharge Placement                       Discharge Plan and Services Additional resources added to the After Visit Summary for                  DME Arranged:  ([facility]) DME Agency:  ([facility])         HH Agency:  ([facility]        Social Drivers of Health (SDOH) Interventions SDOH Screenings   Food Insecurity: No Food Insecurity (03/25/2023)  Housing: Low Risk  (03/25/2023)  Transportation Needs: No Transportation Needs (03/25/2023)  Utilities: Not At Risk (03/25/2023)  Social Connections: Unknown (03/25/2023)  Tobacco Use: Low Risk  (03/25/2023)     Readmission Risk Interventions    03/26/2023   11:29 AM  Readmission Risk Prevention Plan  Transportation Screening Complete  PCP or Specialist Appt within 5-7 Days Complete  Home Care Screening Complete  Medication Review (RN CM) Complete

## 2023-03-28 NOTE — Progress Notes (Signed)
 [facility] Cardiology  SUBJECTIVE: Patient laying in bed comfortably, currently denies chest pain or shortness of breath   Vitals:   03/28/23 0230 03/28/23 0330 03/28/23 0746 03/28/23 1258  BP: (!) 125/92 135/61 122/78 136/85  Pulse: 74 73 87 70  Resp: 16 18 18 18   Temp:  98.1 F (36.7 C) 97.9 F (36.6 C) 98.2 F (36.8 C)  TempSrc:  Oral  Oral  SpO2: 98% 98% 97% 96%  Weight:      Height:         Intake/Output Summary (Last 24 hours) at 03/28/2023 1319 Last data filed at 03/28/2023 0300 Gross per 24 hour  Intake 120 ml  Output --  Net 120 ml      PHYSICAL EXAM  General: Well developed, well nourished, in no acute distress HEENT:  Normocephalic and atramatic Neck:  No JVD.  Lungs: Clear bilaterally to auscultation and percussion. Heart: HRRR . Normal S1 and S2 without gallops or murmurs.  Abdomen: Bowel sounds are positive, abdomen soft and non-tender  Msk:  Back normal, normal gait. Normal strength and tone for age. Extremities: No clubbing, cyanosis or edema.   Neuro: Alert and oriented X 3. Psych:  Good affect, responds appropriately   LABS: Basic Metabolic Panel: Recent Labs    03/26/23 0514 03/27/23 0440 03/28/23 0533  NA 131* 133* 134*  K 3.5 3.7 3.5  CL 102 102 102  CO2 22 23 25   GLUCOSE 108* 114* 111*  BUN 8 9 7*  CREATININE 0.50 0.51 0.50  CALCIUM 8.6* 8.8* 8.9  MG 2.0 2.1  --   PHOS 2.8 2.5  --    Liver Function Tests: No results for input(s): "AST", "ALT", "ALKPHOS", "BILITOT", "PROT", "ALBUMIN" in the last 72 hours. No results for input(s): "LIPASE", "AMYLASE" in the last 72 hours. CBC: Recent Labs    03/27/23 0440 03/28/23 0533  WBC 8.5 7.5  HGB 10.5* 10.8*  HCT 30.5* 31.4*  MCV 90.8 90.0  PLT 152 178   Cardiac Enzymes: No results for input(s): "CKTOTAL", "CKMB", "CKMBINDEX", "TROPONINI" in the last 72 hours. BNP: Invalid input(s): "POCBNP" D-Dimer: No results for input(s): "DDIMER" in the last 72 hours. Hemoglobin A1C: No results for  input(s): "HGBA1C" in the last 72 hours. Fasting Lipid Panel: No results for input(s): "CHOL", "HDL", "LDLCALC", "TRIG", "CHOLHDL", "LDLDIRECT" in the last 72 hours. Thyroid Function Tests: No results for input(s): "TSH", "T4TOTAL", "T3FREE", "THYROIDAB" in the last 72 hours.  Invalid input(s): "FREET3" Anemia Panel: No results for input(s): "VITAMINB12", "FOLATE", "FERRITIN", "TIBC", "IRON", "RETICCTPCT" in the last 72 hours.  DG Abd 1 View Result Date: 03/27/2023 CLINICAL DATA:  Abdominal pain EXAM: ABDOMEN - 1 VIEW COMPARISON:  Pelvic radiographs 07/19/2017 FINDINGS: Supine views of the abdomen and pelvis. Gas within normal caliber colon and stomach. No significant gaseous distention of small-bowel loops. No gross free intraperitoneal air. Probable phleboliths in the pelvis. Moderate convex left lumbar spine curvature. IMPRESSION: No acute findings. Electronically Signed   By: Jeronimo Greaves M.D.   On: 03/27/2023 16:57     Echo EF 60-65%  TELEMETRY: Sinus rhythm 63 bpm:  ASSESSMENT AND PLAN:  Principal Problem:   NSTEMI (non-ST elevated myocardial infarction) (HCC) Active Problems:   Contusion of left knee    1.  Paroxysmal SVT, takes metoprolol succinate 25 mg daily at home, with diltiazem 30 mg twice daily as needed, with recurrent episodes of SVT requiring intermittent doses of IV diltiazem 2.  Elevated troponin (883, 1073, 1137, 929), likely demand  supply ischemia, secondary to SVT, with history of similar presentation thousand 19 while in [place] where cardiac catheterization revealed normal coronary anatomy  Recommendations  1.  Agree with current therapy 2.  Continue metoprolol succinate 50 mg daily 3.  Add Cardizem CD 120 mg daily    Marcina Millard, MD, PhD, [facility] 03/28/2023 1:19 PM

## 2023-07-09 ENCOUNTER — Other Ambulatory Visit: Payer: Self-pay | Admitting: Family Medicine

## 2023-07-09 DIAGNOSIS — M810 Age-related osteoporosis without current pathological fracture: Secondary | ICD-10-CM

## 2023-07-21 IMAGING — DX DG CHEST 1V PORT
1 series · 1 of 1 positions shown · non-contrast
Comparison: September 17, 2018

CLINICAL DATA: Chest pressure.

EXAM:
PORTABLE CHEST 1 VIEW

[chest ap]
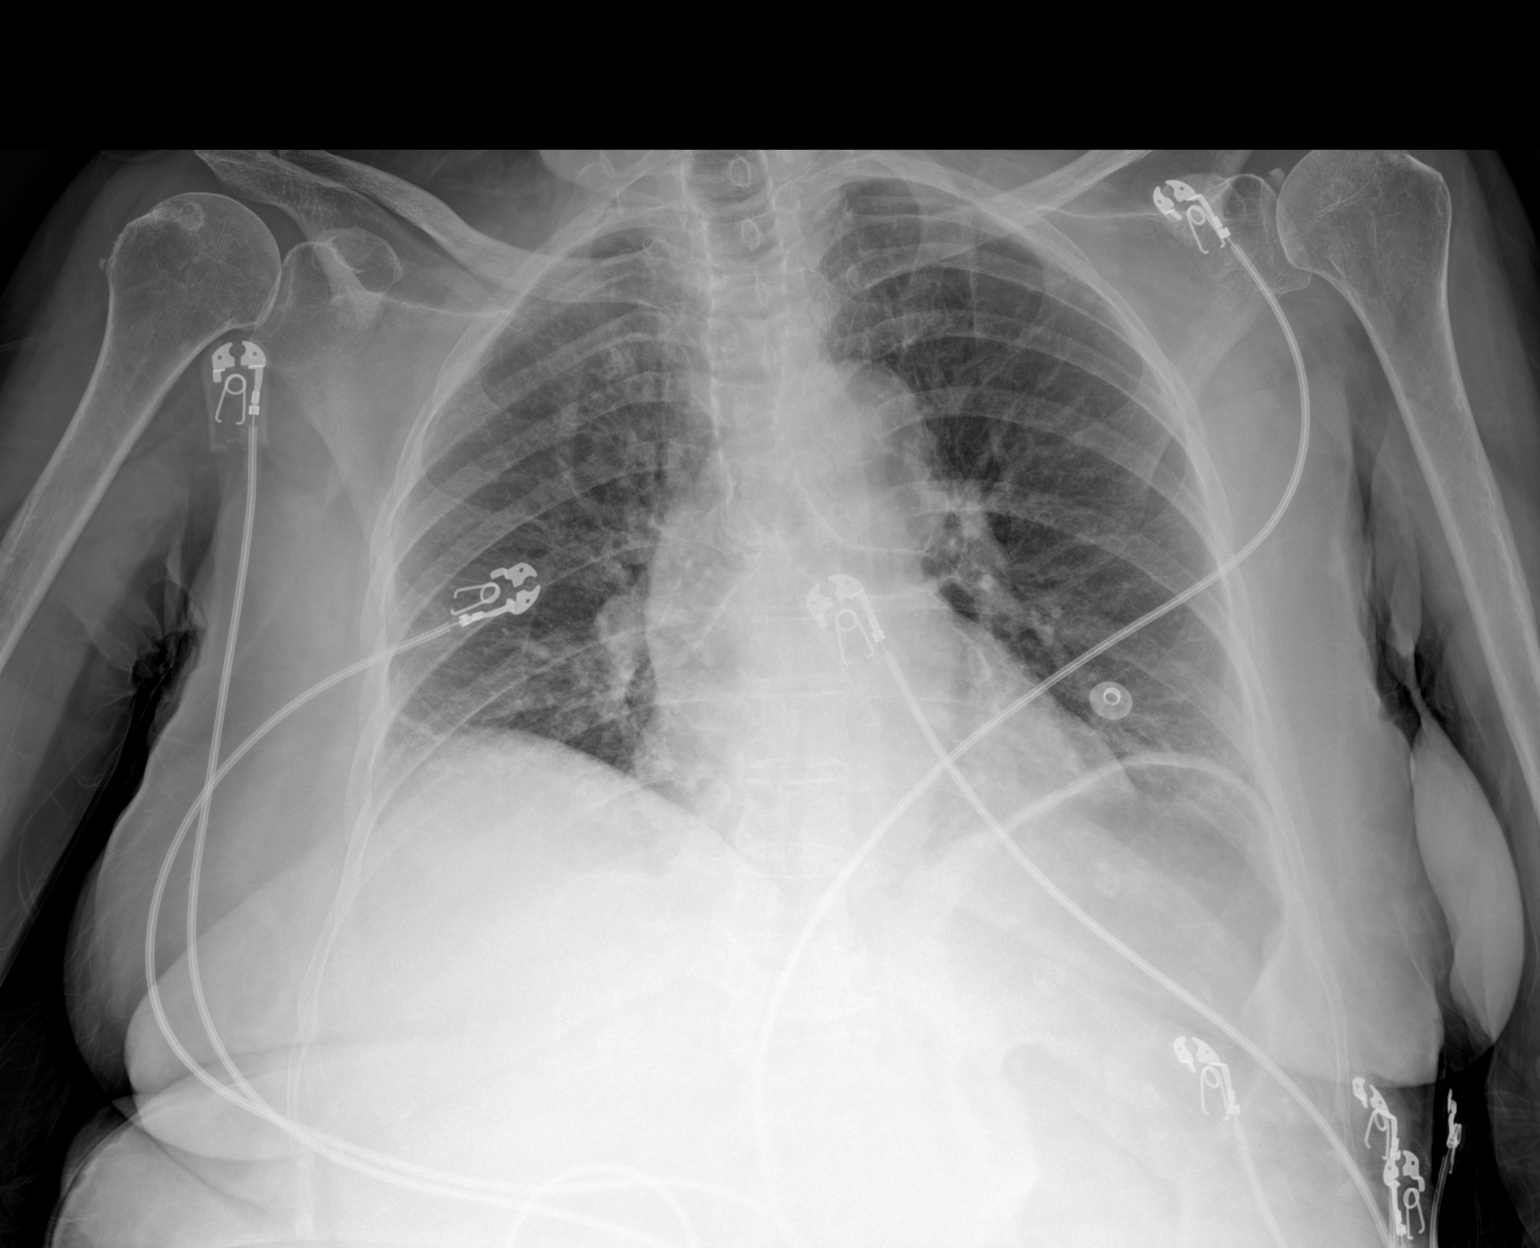

[1 of 1 positions shown; findings below may reference images not displayed]

FINDINGS: Tortuosity of the aorta.

Cardiomediastinal silhouette is normal. Mediastinal contours appear
intact.

There is no evidence of focal airspace consolidation, pleural
effusion or pneumothorax. Low lung volumes.

Osseous structures are without acute abnormality. Soft tissues are
grossly normal.
IMPRESSION: 1. No active disease.
2. Tortuosity of the aorta.

## 2023-09-01 ENCOUNTER — Other Ambulatory Visit: Payer: Medicare (Managed Care)

## 2023-09-08 ENCOUNTER — Ambulatory Visit
Admission: RE | Admit: 2023-09-08 | Discharge: 2023-09-08 | Disposition: A | Discharge status: Home / Self Care | Payer: Medicare (Managed Care) | Source: Ambulatory Visit | Attending: Family Medicine | Admitting: Family Medicine

## 2023-09-08 DIAGNOSIS — M810 Age-related osteoporosis without current pathological fracture: Secondary | ICD-10-CM | POA: Insufficient documentation

## 2023-12-21 ENCOUNTER — Telehealth: Payer: Self-pay

## 2023-12-21 NOTE — Telephone Encounter (Signed)
 Received fax from [facility] requesting echocardiogram Fax given to Chynna Buerkle.

## 2023-12-22 ENCOUNTER — Other Ambulatory Visit: Payer: Self-pay | Admitting: Family Medicine

## 2023-12-22 DIAGNOSIS — R0609 Other forms of dyspnea: Secondary | ICD-10-CM

## 2024-01-03 ENCOUNTER — Ambulatory Visit: Admitting: Cardiology

## 2024-01-04 ENCOUNTER — Other Ambulatory Visit: Payer: Self-pay | Admitting: Family Medicine

## 2024-01-04 ENCOUNTER — Ambulatory Visit: Admission: RE | Admit: 2024-01-04 | Source: Ambulatory Visit

## 2024-01-04 ENCOUNTER — Ambulatory Visit
Admission: RE | Admit: 2024-01-04 | Discharge: 2024-01-04 | Disposition: A | Source: Ambulatory Visit | Attending: Family Medicine | Admitting: Family Medicine

## 2024-01-04 DIAGNOSIS — R1011 Right upper quadrant pain: Secondary | ICD-10-CM

## 2024-01-04 MED ORDER — IOHEXOL 300 MG/ML  SOLN
100.0000 mL | Freq: Once | INTRAMUSCULAR | Status: AC | PRN
  Administered 2024-01-04: 100 mL via INTRAVENOUS

## 2024-01-05 LAB — POCT I-STAT CREATININE: Creatinine, Ser: 0.6 mg/dL (ref 0.44–1.00)

## 2024-02-04 ENCOUNTER — Ambulatory Visit

## 2024-02-23 ENCOUNTER — Ambulatory Visit
